# Patient Record
Sex: Male | Born: 1965 | Race: White | Hispanic: No | Marital: Married | Smoking: Current every day smoker
Health system: Southern US, Community
[De-identification: ages and names within clinical notes are randomized; demographics above are authoritative.]

## PROBLEM LIST (undated history)

## (undated) DIAGNOSIS — R51 Headache: Secondary | ICD-10-CM

## (undated) DIAGNOSIS — J189 Pneumonia, unspecified organism: Secondary | ICD-10-CM

## (undated) DIAGNOSIS — E119 Type 2 diabetes mellitus without complications: Secondary | ICD-10-CM

## (undated) DIAGNOSIS — J40 Bronchitis, not specified as acute or chronic: Secondary | ICD-10-CM

## (undated) DIAGNOSIS — I2692 Saddle embolus of pulmonary artery without acute cor pulmonale: Secondary | ICD-10-CM

## (undated) DIAGNOSIS — I82409 Acute embolism and thrombosis of unspecified deep veins of unspecified lower extremity: Secondary | ICD-10-CM

## (undated) DIAGNOSIS — R519 Headache, unspecified: Secondary | ICD-10-CM

## (undated) HISTORY — DX: Saddle embolus of pulmonary artery without acute cor pulmonale: I26.92

## (undated) HISTORY — PX: OTHER SURGICAL HISTORY: SHX169

## (undated) HISTORY — DX: Acute embolism and thrombosis of unspecified deep veins of unspecified lower extremity: I82.409

## (undated) HISTORY — DX: Type 2 diabetes mellitus without complications: E11.9

---

## 2006-02-19 DIAGNOSIS — J189 Pneumonia, unspecified organism: Secondary | ICD-10-CM

## 2006-02-19 HISTORY — DX: Pneumonia, unspecified organism: J18.9

## 2011-04-24 ENCOUNTER — Other Ambulatory Visit: Payer: Self-pay | Admitting: Occupational Medicine

## 2011-04-24 ENCOUNTER — Ambulatory Visit: Payer: Self-pay

## 2011-04-24 DIAGNOSIS — M79643 Pain in unspecified hand: Secondary | ICD-10-CM

## 2011-07-03 ENCOUNTER — Institutional Professional Consult (permissible substitution): Payer: Self-pay | Admitting: Internal Medicine

## 2012-05-20 ENCOUNTER — Telehealth: Payer: Self-pay | Admitting: Physician Assistant

## 2012-05-20 NOTE — Telephone Encounter (Signed)
APPT MADE

## 2012-05-21 ENCOUNTER — Ambulatory Visit: Payer: Self-pay | Admitting: Physician Assistant

## 2012-06-03 ENCOUNTER — Telehealth: Payer: Self-pay | Admitting: Family Medicine

## 2012-06-03 NOTE — Telephone Encounter (Signed)
Appt made for 06-04-12 

## 2012-06-04 ENCOUNTER — Ambulatory Visit: Payer: Self-pay | Admitting: General Practice

## 2012-09-12 ENCOUNTER — Ambulatory Visit (INDEPENDENT_AMBULATORY_CARE_PROVIDER_SITE_OTHER): Payer: 59

## 2012-09-12 ENCOUNTER — Encounter: Payer: Self-pay | Admitting: Family Medicine

## 2012-09-12 ENCOUNTER — Ambulatory Visit (INDEPENDENT_AMBULATORY_CARE_PROVIDER_SITE_OTHER): Payer: 59 | Admitting: Family Medicine

## 2012-09-12 VITALS — BP 114/70 | HR 77 | Temp 97.0°F | Ht 78.0 in | Wt 289.0 lb

## 2012-09-12 DIAGNOSIS — R0989 Other specified symptoms and signs involving the circulatory and respiratory systems: Secondary | ICD-10-CM

## 2012-09-12 DIAGNOSIS — J069 Acute upper respiratory infection, unspecified: Secondary | ICD-10-CM

## 2012-09-12 MED ORDER — AZITHROMYCIN 250 MG PO TABS: ORAL_TABLET | ORAL | Status: DC

## 2012-09-12 MED ORDER — PREDNISONE 50 MG PO TABS: ORAL_TABLET | ORAL | Status: DC

## 2012-09-12 NOTE — Progress Notes (Signed)
  Subjective:    Patient ID: Kevin Beck, male    DOB: [birth year], 46 y.o.   MRN: [id number]  HPI URI Symptoms Onset: 3-4 days  Description: rhinorrhea, nasal congestion, cough, mild SOB  Modifying factors:  1/2-1 PPD smoker; ? Hx/o COPD   Symptoms Nasal discharge: yes Fever: no Sore throat: no Cough: yes Wheezing: minimal  Ear pain: no GI symptoms: no Sick contacts: no  Red Flags  Stiff neck: no Dyspnea: yes Rash: no Swallowing difficulty: no  Sinusitis Risk Factors Headache/face pain: no Double sickening: no tooth pain: no  Allergy Risk Factors Sneezing: no Itchy scratchy throat: no Seasonal symptoms: no  Flu Risk Factors Headache: no muscle aches:no severe fatigue: no     Review of Systems  All other systems reviewed and are negative.       Objective:   Physical Exam  Constitutional: He appears well-developed and well-nourished.  HENT:  Head: Normocephalic and atraumatic.  Right Ear: External ear normal.  Left Ear: External ear normal.  +nasal erythema, rhinorrhea bilaterally, + post oropharyngeal erythema    Eyes: Conjunctivae are normal. Pupils are equal, round, and reactive to light.  Neck: Normal range of motion. Neck supple.  Cardiovascular: Normal rate, regular rhythm and normal heart sounds.   Pulmonary/Chest: Effort normal.  Faint wheezes and rales in upper lobes    Abdominal: Soft.  Musculoskeletal: Normal range of motion.  Lymphadenopathy:    He has no cervical adenopathy.  Neurological: He is alert.  Skin: Skin is warm.     [facility] reading (PRIMARY) by  Dr. Alvester Morin                                  Preliminary read negative for any focal PNA, though with some mild perihilar markings consistent with ? Bronchitis/URI       Assessment & Plan:  Chest congestion - Plan: DG Chest 2 View  URI (upper respiratory infection) - Plan: predniSONE (DELTASONE) 50 MG tablet, azithromycin (ZITHROMAX) 250 MG tablet  Will place on course of  prednisone and zpak for treatment.  CXR negative for any focal infiltrate Discussed smoking cessation at length.  Discussed infectious and resp red flags at length.  Follow up as needed.     The patient and/or caregiver has been counseled thoroughly with regard to treatment plan and/or medications prescribed including dosage, schedule, interactions, rationale for use, and possible side effects and they verbalize understanding. Diagnoses and expected course of recovery discussed and will return if not improved as expected or if the condition worsens. Patient and/or caregiver verbalized understanding.

## 2012-12-08 ENCOUNTER — Encounter (INDEPENDENT_AMBULATORY_CARE_PROVIDER_SITE_OTHER): Payer: Self-pay | Admitting: Surgery

## 2012-12-08 ENCOUNTER — Ambulatory Visit (INDEPENDENT_AMBULATORY_CARE_PROVIDER_SITE_OTHER): Payer: 59 | Admitting: Family Medicine

## 2012-12-08 ENCOUNTER — Telehealth: Payer: Self-pay | Admitting: Nurse Practitioner

## 2012-12-08 ENCOUNTER — Encounter (INDEPENDENT_AMBULATORY_CARE_PROVIDER_SITE_OTHER): Payer: Self-pay

## 2012-12-08 ENCOUNTER — Encounter: Payer: Self-pay | Admitting: Family Medicine

## 2012-12-08 ENCOUNTER — Ambulatory Visit (INDEPENDENT_AMBULATORY_CARE_PROVIDER_SITE_OTHER): Payer: 59 | Admitting: Surgery

## 2012-12-08 VITALS — BP 118/76 | HR 76 | Temp 98.6°F | Ht 78.0 in | Wt 290.4 lb

## 2012-12-08 VITALS — BP 122/80 | HR 78 | Temp 98.1°F | Resp 18 | Ht 78.0 in | Wt 289.4 lb

## 2012-12-08 DIAGNOSIS — K612 Anorectal abscess: Secondary | ICD-10-CM

## 2012-12-08 DIAGNOSIS — L0231 Cutaneous abscess of buttock: Secondary | ICD-10-CM

## 2012-12-08 DIAGNOSIS — K611 Rectal abscess: Secondary | ICD-10-CM

## 2012-12-08 MED ORDER — METRONIDAZOLE 500 MG PO TABS: 500.0000 mg | ORAL_TABLET | Freq: Three times a day (TID) | ORAL | Status: DC

## 2012-12-08 MED ORDER — SULFAMETHOXAZOLE-TMP DS 800-160 MG PO TABS: 1.0000 | ORAL_TABLET | Freq: Two times a day (BID) | ORAL | Status: DC

## 2012-12-08 MED ORDER — HYDROCODONE-ACETAMINOPHEN 5-325 MG PO TABS: 1.0000 | ORAL_TABLET | Freq: Four times a day (QID) | ORAL | Status: DC | PRN
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
## 2012-12-08 NOTE — Progress Notes (Signed)
  Subjective:    Patient ID: Kevin Beck, male    DOB: [birth year], 47 y.o.   MRN: [id number]  HPI This 47 y.o. male presents for evaluation of fever, malaise, boil on right buttocks, and URI sx's.   Review of Systems C/o abscess, fever, malaise, and uri sx's. No chest pain, SOB, HA, dizziness, vision change, N/V, diarrhea, constipation, dysuria, urinary urgency or frequency, myalgias, arthralgias or rash.     Objective:   Physical Exam Vital signs noted  Well developed well nourished male.  HEENT - Head atraumatic Normocephalic                Eyes - PERRLA, Conjuctiva - clear Sclera- Clear EOMI                Ears - EAC's Wnl TM's Wnl Gross Hearing WNL                Nose - Nares patent                 Throat - oropharanx wnl Respiratory - Lungs CTA bilateral Cardiac - RRR S1 and S2 without murmur Rectal - Right buttock with perirectal abscess that is draining.       Assessment & Plan:  Perirectal abscess - Plan: sulfamethoxazole-trimethoprim (BACTRIM DS) 800-160 MG per tablet, metroNIDAZOLE (FLAGYL) 500 MG tablet, HYDROcodone-acetaminophen (NORCO) 5-325 MG per tablet, Ambulatory referral to General Surgery.  General Surgery Consult ASAP.  Deatra Canter FNP

## 2012-12-08 NOTE — Telephone Encounter (Signed)
Appt given for today 

## 2012-12-08 NOTE — Patient Instructions (Signed)
Abscess An abscess is an infected area that contains a collection of pus and debris.It can occur in almost any part of the body. An abscess is also known as a furuncle or boil. CAUSES  An abscess occurs when tissue gets infected. This can occur from blockage of oil or sweat glands, infection of hair follicles, or a minor injury to the skin. As the body tries to fight the infection, pus collects in the area and creates pressure under the skin. This pressure causes pain. People with weakened immune systems have difficulty fighting infections and get certain abscesses more often.  SYMPTOMS Usually an abscess develops on the skin and becomes a painful mass that is red, warm, and tender. If the abscess forms under the skin, you may feel a moveable soft area under the skin. Some abscesses break open (rupture) on their own, but most will continue to get worse without care. The infection can spread deeper into the body and eventually into the bloodstream, causing you to feel ill.  DIAGNOSIS  Your caregiver will take your medical history and perform a physical exam. A sample of fluid may also be taken from the abscess to determine what is causing your infection. TREATMENT  Your caregiver may prescribe antibiotic medicines to fight the infection. However, taking antibiotics alone usually does not cure an abscess. Your caregiver may need to make a small cut (incision) in the abscess to drain the pus. In some cases, gauze is packed into the abscess to reduce pain and to continue draining the area. HOME CARE INSTRUCTIONS   Only take over-the-counter or prescription medicines for pain, discomfort, or fever as directed by your caregiver.  If you were prescribed antibiotics, take them as directed. Finish them even if you start to feel better.  If gauze is used, follow your caregiver's directions for changing the gauze.  To avoid spreading the infection:  Keep your draining abscess covered with a  bandage.  Wash your hands well.  Do not share personal care items, towels, or whirlpools with others.  Avoid skin contact with others.  Keep your skin and clothes clean around the abscess.  Keep all follow-up appointments as directed by your caregiver. SEEK MEDICAL CARE IF:   You have increased pain, swelling, redness, fluid drainage, or bleeding.  You have muscle aches, chills, or a general ill feeling.  You have a fever. MAKE SURE YOU:   Understand these instructions.  Will watch your condition.  Will get help right away if you are not doing well or get worse. Document Released: 11/15/2004 Document Revised: 08/07/2011 Document Reviewed: 04/20/2011 ExitCare Patient Information 2014 [employer].  

## 2012-12-09 ENCOUNTER — Telehealth: Payer: Self-pay | Admitting: Family Medicine

## 2013-01-05 ENCOUNTER — Other Ambulatory Visit (INDEPENDENT_AMBULATORY_CARE_PROVIDER_SITE_OTHER): Payer: 59

## 2013-01-05 DIAGNOSIS — M7989 Other specified soft tissue disorders: Secondary | ICD-10-CM

## 2013-01-05 NOTE — Progress Notes (Signed)
Pt came in for labs from dr. Andres Labrum

## 2013-01-06 LAB — CBC WITH DIFFERENTIAL
Basophils Absolute: 0.1 10*3/uL (ref 0.0–0.2)
Basos: 1 %
Eos: 4 %
Eosinophils Absolute: 0.2 10*3/uL (ref 0.0–0.4)
HCT: 44.3 % (ref 37.5–51.0)
Hemoglobin: 16.1 g/dL (ref 12.6–17.7)
Immature Grans (Abs): 0 10*3/uL (ref 0.0–0.1)
Immature Granulocytes: 0 %
Lymphocytes Absolute: 1.9 10*3/uL (ref 0.7–3.1)
Lymphs: 29 %
MCH: 31 pg (ref 26.6–33.0)
MCHC: 36.3 g/dL — ABNORMAL HIGH (ref 31.5–35.7)
MCV: 85 fL (ref 79–97)
Monocytes Absolute: 0.6 10*3/uL (ref 0.1–0.9)
Monocytes: 9 %
Neutrophils Absolute: 3.9 10*3/uL (ref 1.4–7.0)
Neutrophils Relative %: 57 %
Platelets: 161 10*3/uL (ref 150–379)
RBC: 5.2 x10E6/uL (ref 4.14–5.80)
RDW: 13.4 % (ref 12.3–15.4)
WBC: 6.7 10*3/uL (ref 3.4–10.8)

## 2013-01-06 LAB — SEDIMENTATION RATE: Sed Rate: 5 mm/hr (ref 0–15)

## 2013-01-06 LAB — URIC ACID: Uric Acid: 5.1 mg/dL (ref 3.7–8.6)

## 2013-01-06 LAB — RHEUMATOID FACTOR: Rhuematoid fact SerPl-aCnc: 10.2 IU/mL (ref 0.0–13.9)

## 2013-01-06 LAB — C-REACTIVE PROTEIN: CRP: 9.9 mg/L — ABNORMAL HIGH (ref 0.0–4.9)

## 2013-01-21 ENCOUNTER — Telehealth: Payer: Self-pay | Admitting: *Deleted

## 2013-01-21 NOTE — Telephone Encounter (Signed)
Message copied by Baltazar Apo on Wed Jan 21, 2013  8:56 AM ------      Message from: Deatra Canter      Created: Wed Jan 07, 2013  9:39 AM       Arthritis lab shows mildly elevated CRP.  Can refer to RA if arthritis pain is not improving. ------

## 2013-02-10 NOTE — Telephone Encounter (Signed)
He wants lab results sent to Dr. Lorie Phenix - a bone  / joint specialist in [facility].

## 2013-02-16 NOTE — Telephone Encounter (Signed)
Please send labs

## 2013-11-05 ENCOUNTER — Encounter: Payer: Self-pay | Admitting: *Deleted

## 2013-11-05 ENCOUNTER — Ambulatory Visit (INDEPENDENT_AMBULATORY_CARE_PROVIDER_SITE_OTHER): Payer: PRIVATE HEALTH INSURANCE | Admitting: Family Medicine

## 2013-11-05 ENCOUNTER — Encounter: Payer: Self-pay | Admitting: Family Medicine

## 2013-11-05 VITALS — BP 126/75 | HR 72 | Temp 98.1°F | Ht 78.0 in | Wt 286.6 lb

## 2013-11-05 DIAGNOSIS — J208 Acute bronchitis due to other specified organisms: Secondary | ICD-10-CM

## 2013-11-05 DIAGNOSIS — J209 Acute bronchitis, unspecified: Secondary | ICD-10-CM

## 2013-11-05 MED ORDER — LEVOFLOXACIN 500 MG PO TABS: 500.0000 mg | ORAL_TABLET | Freq: Every day | ORAL | Status: DC

## 2013-11-05 MED ORDER — METHYLPREDNISOLONE (PAK) 4 MG PO TABS: ORAL_TABLET | ORAL | Status: DC

## 2013-11-05 MED ORDER — HYDROCODONE-HOMATROPINE 5-1.5 MG/5ML PO SYRP: 5.0000 mL | ORAL_SOLUTION | Freq: Three times a day (TID) | ORAL | Status: DC | PRN

## 2013-11-05 NOTE — Progress Notes (Signed)
   Subjective:    Patient ID: Kevin Beck, male    DOB: 01-30-1966, 48 y.o.   MRN: [id number]  HPI This 48 y.o. male presents for evaluation of uri sx's and cough.   Review of Systems No chest pain, SOB, HA, dizziness, vision change, N/V, diarrhea, constipation, dysuria, urinary urgency or frequency, myalgias, arthralgias or rash.     Objective:   Physical Exam Vital signs noted  Well developed well nourished male.  HEENT - Head atraumatic Normocephalic                Eyes - PERRLA, Conjuctiva - clear Sclera- Clear EOMI                Ears - EAC's Wnl TM's Wnl Gross Hearing WNL                Nose - Nares patent                 Throat - oropharanx wnl Respiratory - Lungs CTA bilateral Cardiac - RRR S1 and S2 without murmur GI - Abdomen soft Nontender and bowel sounds active x 4 Extremities - No edema. Neuro - Grossly intact.       Assessment & Plan:  Acute bronchitis due to other specified organisms - Plan: levofloxacin (LEVAQUIN) 500 MG tablet, methylPREDNIsolone (MEDROL DOSPACK) 4 MG tablet, HYDROcodone-homatropine (HYCODAN) 5-1.5 MG/5ML syrup  Lysbeth Penner FNP

## 2014-03-15 ENCOUNTER — Other Ambulatory Visit (HOSPITAL_COMMUNITY): Payer: Self-pay | Admitting: *Deleted

## 2014-03-15 ENCOUNTER — Other Ambulatory Visit: Payer: Self-pay | Admitting: Orthopedic Surgery

## 2014-03-15 NOTE — Pre-Procedure Instructions (Signed)
Kevin Beck  03/15/2014   Your procedure is scheduled on:  Thursday, March 18, 2014 at 7:30 AM.   Report to [facility] Entrance "A" Admitting Office at 5:30 AM.   Call this number if you have problems the morning of surgery: [phone]    Remember:   Do not eat food or drink liquids after midnight Wednesday, 03/17/14.   Take these medicines the morning of surgery with A SIP OF WATER: Hydrocodone - if needed.   Do not wear jewelry.  Do not wear lotions, powders, or cologne. You may wear deodorant.  Men may shave face and neck.  Do not bring valuables to the hospital.  [facility] is not responsible                  for any belongings or valuables.               Contacts, dentures or bridgework may not be worn into surgery.  Leave suitcase in the car. After surgery it may be brought to your room.  For patients admitted to the hospital, discharge time is determined by your                treatment team.              Special Instructions: [facility] - Preparing for Surgery  Before surgery, you can play an important role.  Because skin is not sterile, your skin needs to be as free of germs as possible.  You can reduce the number of germs on you skin by washing with CHG (chlorahexidine gluconate) soap before surgery.  CHG is an antiseptic cleaner which kills germs and bonds with the skin to continue killing germs even after washing.  Please DO NOT use if you have an allergy to CHG or antibacterial soaps.  If your skin becomes reddened/irritated stop using the CHG and inform your nurse when you arrive at Short Stay.  Do not shave (including legs and underarms) for at least 48 hours prior to the first CHG shower.  You may shave your face.  Please follow these instructions carefully:   1.  Shower with CHG Soap the night before surgery and the                                morning of Surgery.  2.  If you choose to wash your hair, wash your hair first as usual with your        normal shampoo.  3.  After you shampoo, rinse your hair and body thoroughly to remove the                      Shampoo.  4.  Use CHG as you would any other liquid soap.  You can apply chg directly       to the skin and wash gently with scrungie or a clean washcloth.  5.  Apply the CHG Soap to your body ONLY FROM THE NECK DOWN.        Do not use on open wounds or open sores.  Avoid contact with your eyes, ears, mouth and genitals (private parts).  Wash genitals (private parts) with your normal soap.  6.  Wash thoroughly, paying special attention to the area where your surgery        will be performed.  7.  Thoroughly rinse your body with warm water  from the neck down.  8.  DO NOT shower/wash with your normal soap after using and rinsing off       the CHG Soap.  9.  Pat yourself dry with a clean towel.            10.  Wear clean pajamas.            11.  Place clean sheets on your bed the night of your first shower and do not        sleep with pets.  Day of Surgery  Do not apply any lotions the morning of surgery.  Please wear clean clothes to the hospital.     Please read over the following fact sheets that you were given: Pain Booklet, Coughing and Deep Breathing, Blood Transfusion Information, MRSA Information and Surgical Site Infection Prevention

## 2014-03-15 NOTE — Progress Notes (Signed)
Pre-op orders not signed in EPIC. Called Dr. Laurena Bering office and spoke with Angela Nevin requesting orders be signed.

## 2014-03-16 ENCOUNTER — Encounter (HOSPITAL_COMMUNITY)
Admission: RE | Admit: 2014-03-16 | Discharge: 2014-03-16 | Disposition: A | Discharge status: Home / Self Care | Payer: Worker's Compensation | Source: Ambulatory Visit | Attending: Orthopedic Surgery | Admitting: Orthopedic Surgery

## 2014-03-16 ENCOUNTER — Encounter (HOSPITAL_COMMUNITY): Payer: Self-pay

## 2014-03-16 DIAGNOSIS — J42 Unspecified chronic bronchitis: Secondary | ICD-10-CM | POA: Diagnosis not present

## 2014-03-16 DIAGNOSIS — M5412 Radiculopathy, cervical region: Secondary | ICD-10-CM | POA: Diagnosis not present

## 2014-03-16 DIAGNOSIS — Z01818 Encounter for other preprocedural examination: Secondary | ICD-10-CM | POA: Diagnosis not present

## 2014-03-16 DIAGNOSIS — F17210 Nicotine dependence, cigarettes, uncomplicated: Secondary | ICD-10-CM | POA: Diagnosis not present

## 2014-03-16 DIAGNOSIS — G9529 Other cord compression: Secondary | ICD-10-CM | POA: Diagnosis present

## 2014-03-16 DIAGNOSIS — Z880 Allergy status to penicillin: Secondary | ICD-10-CM | POA: Diagnosis not present

## 2014-03-16 DIAGNOSIS — M4802 Spinal stenosis, cervical region: Secondary | ICD-10-CM | POA: Diagnosis not present

## 2014-03-16 DIAGNOSIS — Z87891 Personal history of nicotine dependence: Secondary | ICD-10-CM | POA: Insufficient documentation

## 2014-03-16 HISTORY — DX: Headache, unspecified: R51.9

## 2014-03-16 HISTORY — DX: Bronchitis, not specified as acute or chronic: J40

## 2014-03-16 HISTORY — DX: Headache: R51

## 2014-03-16 HISTORY — DX: Pneumonia, unspecified organism: J18.9

## 2014-03-16 LAB — COMPREHENSIVE METABOLIC PANEL
ALT: 53 U/L (ref 0–53)
AST: 35 U/L (ref 0–37)
Albumin: 3.9 g/dL (ref 3.5–5.2)
Alkaline Phosphatase: 100 U/L (ref 39–117)
Anion gap: 10 (ref 5–15)
BUN: 10 mg/dL (ref 6–23)
CALCIUM: 9.3 mg/dL (ref 8.4–10.5)
CHLORIDE: 107 mmol/L (ref 96–112)
CO2: 24 mmol/L (ref 19–32)
CREATININE: 0.99 mg/dL (ref 0.50–1.35)
GFR calc Af Amer: >90 mL/min (ref >90)
GFR calc non Af Amer: >90 mL/min (ref >90)
Glucose, Bld: 116 mg/dL — ABNORMAL HIGH (ref 70–99)
Potassium: 4.2 mmol/L (ref 3.5–5.1)
Sodium: 141 mmol/L (ref 135–145)
TOTAL PROTEIN: 7 g/dL (ref 6.0–8.3)
Total Bilirubin: 0.6 mg/dL (ref 0.3–1.2)

## 2014-03-16 LAB — CBC WITH DIFFERENTIAL/PLATELET
BASOS ABS: 0.1 10*3/uL (ref 0.0–0.1)
Basophils Relative: 1 % (ref 0–1)
EOS ABS: 0.2 10*3/uL (ref 0.0–0.7)
EOS PCT: 3 % (ref 0–5)
HCT: 48.4 % (ref 39.0–52.0)
Hemoglobin: 17 g/dL (ref 13.0–17.0)
Lymphocytes Relative: 25 % (ref 12–46)
Lymphs Abs: 1.6 10*3/uL (ref 0.7–4.0)
MCH: 30.6 pg (ref 26.0–34.0)
MCHC: 35.1 g/dL (ref 30.0–36.0)
MCV: 87.1 fL (ref 78.0–100.0)
MONO ABS: 0.5 10*3/uL (ref 0.1–1.0)
MONOS PCT: 7 % (ref 3–12)
Neutro Abs: 4.2 10*3/uL (ref 1.7–7.7)
Neutrophils Relative %: 64 % (ref 43–77)
Platelets: 169 10*3/uL (ref 150–400)
RBC: 5.56 MIL/uL (ref 4.22–5.81)
RDW: 12.9 % (ref 11.5–15.5)
WBC: 6.6 10*3/uL (ref 4.0–10.5)

## 2014-03-16 LAB — PROTIME-INR
INR: 0.93 (ref 0.00–1.49)
Prothrombin Time: 12.6 seconds (ref 11.6–15.2)

## 2014-03-16 LAB — SURGICAL PCR SCREEN
MRSA, PCR: NEGATIVE
STAPHYLOCOCCUS AUREUS: NEGATIVE

## 2014-03-16 LAB — URINALYSIS, ROUTINE W REFLEX MICROSCOPIC
Bilirubin Urine: NEGATIVE
GLUCOSE, UA: NEGATIVE mg/dL
HGB URINE DIPSTICK: NEGATIVE
Ketones, ur: NEGATIVE mg/dL
Leukocytes, UA: NEGATIVE
NITRITE: NEGATIVE
PROTEIN: NEGATIVE mg/dL
SPECIFIC GRAVITY, URINE: 1.023 (ref 1.005–1.030)
UROBILINOGEN UA: 0.2 mg/dL (ref 0.0–1.0)
pH: 5 (ref 5.0–8.0)

## 2014-03-16 LAB — TYPE AND SCREEN
ABO/RH(D): O POS
ANTIBODY SCREEN: NEGATIVE

## 2014-03-16 LAB — APTT: aPTT: 31 seconds (ref 24–37)

## 2014-03-16 LAB — ABO/RH: ABO/RH(D): O POS

## 2014-03-17 MED ORDER — VANCOMYCIN HCL 10 G IV SOLR
1500.0000 mg | INTRAVENOUS | Status: AC
  Administered 2014-03-18: 1500 mg via INTRAVENOUS
  Filled 2014-03-17: qty 1500
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
## 2014-03-18 ENCOUNTER — Ambulatory Visit (HOSPITAL_COMMUNITY): Payer: Worker's Compensation

## 2014-03-18 ENCOUNTER — Ambulatory Visit (HOSPITAL_COMMUNITY): Payer: Worker's Compensation | Admitting: Anesthesiology

## 2014-03-18 ENCOUNTER — Encounter (HOSPITAL_COMMUNITY)
Admission: RE | Disposition: A | Discharge status: Home / Self Care | Payer: Self-pay | Source: Ambulatory Visit | Attending: Orthopedic Surgery

## 2014-03-18 ENCOUNTER — Ambulatory Visit (HOSPITAL_COMMUNITY)
Admission: RE | Admit: 2014-03-18 | Discharge: 2014-03-19 | Disposition: A | Discharge status: Home / Self Care | Payer: Worker's Compensation | Source: Ambulatory Visit | Attending: Orthopedic Surgery | Admitting: Orthopedic Surgery

## 2014-03-18 ENCOUNTER — Encounter (HOSPITAL_COMMUNITY): Payer: Self-pay | Admitting: *Deleted

## 2014-03-18 DIAGNOSIS — M5412 Radiculopathy, cervical region: Secondary | ICD-10-CM | POA: Insufficient documentation

## 2014-03-18 DIAGNOSIS — M4802 Spinal stenosis, cervical region: Secondary | ICD-10-CM | POA: Insufficient documentation

## 2014-03-18 DIAGNOSIS — G549 Nerve root and plexus disorder, unspecified: Secondary | ICD-10-CM | POA: Diagnosis present

## 2014-03-18 DIAGNOSIS — Z880 Allergy status to penicillin: Secondary | ICD-10-CM | POA: Insufficient documentation

## 2014-03-18 DIAGNOSIS — F17210 Nicotine dependence, cigarettes, uncomplicated: Secondary | ICD-10-CM | POA: Insufficient documentation

## 2014-03-18 DIAGNOSIS — Z419 Encounter for procedure for purposes other than remedying health state, unspecified: Secondary | ICD-10-CM

## 2014-03-18 DIAGNOSIS — G9529 Other cord compression: Secondary | ICD-10-CM | POA: Insufficient documentation

## 2014-03-18 HISTORY — PX: ANTERIOR CERVICAL DECOMP/DISCECTOMY FUSION: SHX1161

## 2014-03-18 SURGERY — ANTERIOR CERVICAL DECOMPRESSION/DISCECTOMY FUSION 2 LEVELS
Anesthesia: General | Site: Neck

## 2014-03-18 MED ORDER — HYDROMORPHONE HCL 1 MG/ML IJ SOLN
0.2500 mg | INTRAMUSCULAR | Status: DC | PRN
  Administered 2014-03-18 (×2): 0.5 mg via INTRAVENOUS

## 2014-03-18 MED ORDER — LIDOCAINE HCL (CARDIAC) 20 MG/ML IV SOLN
INTRAVENOUS | Status: AC
  Filled 2014-03-18: qty 5

## 2014-03-18 MED ORDER — HYDROCODONE-HOMATROPINE 5-1.5 MG/5ML PO SYRP: 5.0000 mL | ORAL_SOLUTION | Freq: Three times a day (TID) | ORAL | Status: DC | PRN

## 2014-03-18 MED ORDER — OXYCODONE-ACETAMINOPHEN 5-325 MG PO TABS
1.0000 | ORAL_TABLET | ORAL | Status: DC | PRN
  Administered 2014-03-18 – 2014-03-19 (×3): 2 via ORAL
  Filled 2014-03-18 (×3): qty 2

## 2014-03-18 MED ORDER — LIDOCAINE HCL (CARDIAC) 20 MG/ML IV SOLN
INTRAVENOUS | Status: AC
  Filled 2014-03-18: qty 10

## 2014-03-18 MED ORDER — FENTANYL CITRATE 0.05 MG/ML IJ SOLN
INTRAMUSCULAR | Status: DC | PRN
  Administered 2014-03-18 (×4): 50 ug via INTRAVENOUS
  Administered 2014-03-18 (×2): 100 ug via INTRAVENOUS
  Administered 2014-03-18: 50 ug via INTRAVENOUS
  Administered 2014-03-18: 100 ug via INTRAVENOUS
  Administered 2014-03-18: 150 ug via INTRAVENOUS
  Administered 2014-03-18: 50 ug via INTRAVENOUS

## 2014-03-18 MED ORDER — SENNOSIDES-DOCUSATE SODIUM 8.6-50 MG PO TABS
1.0000 | ORAL_TABLET | Freq: Every evening | ORAL | Status: DC | PRN
  Filled 2014-03-18: qty 1

## 2014-03-18 MED ORDER — BUPIVACAINE-EPINEPHRINE (PF) 0.25% -1:200000 IJ SOLN
INTRAMUSCULAR | Status: AC
  Filled 2014-03-18: qty 30

## 2014-03-18 MED ORDER — GLYCOPYRROLATE 0.2 MG/ML IJ SOLN
INTRAMUSCULAR | Status: AC
  Filled 2014-03-18: qty 3

## 2014-03-18 MED ORDER — SODIUM CHLORIDE 0.9 % IJ SOLN
3.0000 mL | Freq: Two times a day (BID) | INTRAMUSCULAR | Status: DC
  Administered 2014-03-18 (×2): 3 mL via INTRAVENOUS

## 2014-03-18 MED ORDER — ARTIFICIAL TEARS OP OINT
TOPICAL_OINTMENT | OPHTHALMIC | Status: AC
  Filled 2014-03-18: qty 3.5

## 2014-03-18 MED ORDER — THROMBIN 20000 UNITS EX SOLR
CUTANEOUS | Status: AC
  Filled 2014-03-18: qty 40000

## 2014-03-18 MED ORDER — SODIUM CHLORIDE 0.9 % IV SOLN
INTRAVENOUS | Status: DC | PRN
  Administered 2014-03-18: 07:00:00 via INTRAVENOUS

## 2014-03-18 MED ORDER — FENTANYL CITRATE 0.05 MG/ML IJ SOLN
INTRAMUSCULAR | Status: AC
  Filled 2014-03-18: qty 5

## 2014-03-18 MED ORDER — MORPHINE SULFATE 2 MG/ML IJ SOLN: 1.0000 mg | INTRAMUSCULAR | Status: DC | PRN

## 2014-03-18 MED ORDER — ONDANSETRON HCL 4 MG/2ML IJ SOLN
INTRAMUSCULAR | Status: AC
  Filled 2014-03-18: qty 2

## 2014-03-18 MED ORDER — ONDANSETRON HCL 4 MG/2ML IJ SOLN
INTRAMUSCULAR | Status: DC | PRN
  Administered 2014-03-18 (×2): 4 mg via INTRAVENOUS

## 2014-03-18 MED ORDER — PHENYLEPHRINE 40 MCG/ML (10ML) SYRINGE FOR IV PUSH (FOR BLOOD PRESSURE SUPPORT)
PREFILLED_SYRINGE | INTRAVENOUS | Status: AC
  Filled 2014-03-18: qty 20

## 2014-03-18 MED ORDER — VANCOMYCIN HCL 10 G IV SOLR
1250.0000 mg | Freq: Once | INTRAVENOUS | Status: AC
  Administered 2014-03-18: 1250 mg via INTRAVENOUS
  Filled 2014-03-18: qty 1250

## 2014-03-18 MED ORDER — LACTATED RINGERS IV SOLN
INTRAVENOUS | Status: DC | PRN
  Administered 2014-03-18 (×2): via INTRAVENOUS

## 2014-03-18 MED ORDER — HYDROMORPHONE HCL 1 MG/ML IJ SOLN
INTRAMUSCULAR | Status: AC
  Administered 2014-03-18: 0.5 mg via INTRAVENOUS
  Filled 2014-03-18: qty 1

## 2014-03-18 MED ORDER — PHENYLEPHRINE HCL 10 MG/ML IJ SOLN
10.0000 mg | INTRAMUSCULAR | Status: DC | PRN
  Administered 2014-03-18 (×2): 15 ug/min via INTRAVENOUS

## 2014-03-18 MED ORDER — LIDOCAINE HCL (CARDIAC) 20 MG/ML IV SOLN
INTRAVENOUS | Status: DC | PRN
  Administered 2014-03-18: 100 mg via INTRAVENOUS

## 2014-03-18 MED ORDER — ACETAMINOPHEN 325 MG PO TABS: 650.0000 mg | ORAL_TABLET | ORAL | Status: DC | PRN

## 2014-03-18 MED ORDER — MENTHOL 3 MG MT LOZG
1.0000 | LOZENGE | OROMUCOSAL | Status: DC | PRN
  Filled 2014-03-18: qty 9

## 2014-03-18 MED ORDER — PROMETHAZINE HCL 25 MG/ML IJ SOLN: 6.2500 mg | INTRAMUSCULAR | Status: DC | PRN

## 2014-03-18 MED ORDER — LIDOCAINE HCL 4 % MT SOLN
OROMUCOSAL | Status: DC | PRN
  Administered 2014-03-18: 4 mL via TOPICAL

## 2014-03-18 MED ORDER — PHENOL 1.4 % MT LIQD: 1.0000 | OROMUCOSAL | Status: DC | PRN

## 2014-03-18 MED ORDER — 0.9 % SODIUM CHLORIDE (POUR BTL) OPTIME
TOPICAL | Status: DC | PRN
  Administered 2014-03-18: 1000 mL

## 2014-03-18 MED ORDER — ACETAMINOPHEN 650 MG RE SUPP: 650.0000 mg | RECTAL | Status: DC | PRN

## 2014-03-18 MED ORDER — THROMBIN 20000 UNITS EX SOLR
CUTANEOUS | Status: DC | PRN
  Administered 2014-03-18: 20 mL via TOPICAL

## 2014-03-18 MED ORDER — ALUM & MAG HYDROXIDE-SIMETH 200-200-20 MG/5ML PO SUSP
30.0000 mL | Freq: Four times a day (QID) | ORAL | Status: DC | PRN
Start: 2014-03-18 — End: 2014-03-19

## 2014-03-18 MED ORDER — SODIUM CHLORIDE 0.9 % IJ SOLN: 3.0000 mL | INTRAMUSCULAR | Status: DC | PRN

## 2014-03-18 MED ORDER — MIDAZOLAM HCL 5 MG/5ML IJ SOLN
INTRAMUSCULAR | Status: DC | PRN
Start: 2014-03-18 — End: 2014-03-18
  Administered 2014-03-18: 2 mg via INTRAVENOUS

## 2014-03-18 MED ORDER — MIDAZOLAM HCL 2 MG/2ML IJ SOLN
INTRAMUSCULAR | Status: AC
  Filled 2014-03-18: qty 2

## 2014-03-18 MED ORDER — ARTIFICIAL TEARS OP OINT
TOPICAL_OINTMENT | OPHTHALMIC | Status: DC | PRN
  Administered 2014-03-18: 1 via OPHTHALMIC

## 2014-03-18 MED ORDER — STERILE WATER FOR INJECTION IJ SOLN
INTRAMUSCULAR | Status: AC
  Filled 2014-03-18: qty 20

## 2014-03-18 MED ORDER — ONDANSETRON HCL 4 MG/2ML IJ SOLN: 4.0000 mg | INTRAMUSCULAR | Status: DC | PRN

## 2014-03-18 MED ORDER — DOCUSATE SODIUM 100 MG PO CAPS
100.0000 mg | ORAL_CAPSULE | Freq: Two times a day (BID) | ORAL | Status: DC
Start: 2014-03-18 — End: 2014-03-19
  Administered 2014-03-18: 100 mg via ORAL
  Filled 2014-03-18: qty 1

## 2014-03-18 MED ORDER — PROPOFOL 10 MG/ML IV BOLUS
INTRAVENOUS | Status: AC
  Filled 2014-03-18: qty 20

## 2014-03-18 MED ORDER — BISACODYL 5 MG PO TBEC
5.0000 mg | DELAYED_RELEASE_TABLET | Freq: Every day | ORAL | Status: DC | PRN
  Filled 2014-03-18: qty 1

## 2014-03-18 MED ORDER — DIAZEPAM 5 MG PO TABS
ORAL_TABLET | ORAL | Status: AC
  Filled 2014-03-18: qty 1

## 2014-03-18 MED ORDER — BUPIVACAINE-EPINEPHRINE 0.25% -1:200000 IJ SOLN
INTRAMUSCULAR | Status: DC | PRN
  Administered 2014-03-18: 3 mL

## 2014-03-18 MED ORDER — ONDANSETRON HCL 4 MG/2ML IJ SOLN
4.0000 mg | Freq: Once | INTRAMUSCULAR | Status: AC
  Administered 2014-03-18: 4 mg via INTRAVENOUS

## 2014-03-18 MED ORDER — FLEET ENEMA 7-19 GM/118ML RE ENEM
1.0000 | ENEMA | Freq: Once | RECTAL | Status: AC | PRN
  Filled 2014-03-18: qty 1

## 2014-03-18 MED ORDER — SUCCINYLCHOLINE CHLORIDE 20 MG/ML IJ SOLN
INTRAMUSCULAR | Status: AC
  Filled 2014-03-18: qty 1

## 2014-03-18 MED ORDER — VECURONIUM BROMIDE 10 MG IV SOLR
INTRAVENOUS | Status: AC
  Filled 2014-03-18: qty 20

## 2014-03-18 MED ORDER — ONDANSETRON HCL 4 MG/2ML IJ SOLN
INTRAMUSCULAR | Status: AC
  Filled 2014-03-18: qty 4

## 2014-03-18 MED ORDER — DEXAMETHASONE SODIUM PHOSPHATE 10 MG/ML IJ SOLN
INTRAMUSCULAR | Status: AC
  Filled 2014-03-18: qty 2

## 2014-03-18 MED ORDER — VECURONIUM BROMIDE 10 MG IV SOLR
INTRAVENOUS | Status: DC | PRN
  Administered 2014-03-18: 5 mg via INTRAVENOUS
  Administered 2014-03-18: 10 mg via INTRAVENOUS

## 2014-03-18 MED ORDER — DIAZEPAM 5 MG PO TABS
5.0000 mg | ORAL_TABLET | Freq: Four times a day (QID) | ORAL | Status: DC | PRN
  Administered 2014-03-18 – 2014-03-19 (×3): 5 mg via ORAL
  Filled 2014-03-18 (×3): qty 1

## 2014-03-18 MED ORDER — PROPOFOL 10 MG/ML IV BOLUS
INTRAVENOUS | Status: DC | PRN
  Administered 2014-03-18: 310 mg via INTRAVENOUS
  Administered 2014-03-18: 90 mg via INTRAVENOUS

## 2014-03-18 MED ORDER — GLYCOPYRROLATE 0.2 MG/ML IJ SOLN
INTRAMUSCULAR | Status: DC | PRN
  Administered 2014-03-18: 0.6 mg via INTRAVENOUS

## 2014-03-18 MED ORDER — DEXAMETHASONE SODIUM PHOSPHATE 10 MG/ML IJ SOLN
INTRAMUSCULAR | Status: DC | PRN
  Administered 2014-03-18: 10 mg via INTRAVENOUS

## 2014-03-18 MED ORDER — SUCCINYLCHOLINE CHLORIDE 20 MG/ML IJ SOLN
INTRAMUSCULAR | Status: DC | PRN
  Administered 2014-03-18: 160 mg via INTRAVENOUS

## 2014-03-18 MED ORDER — NEOSTIGMINE METHYLSULFATE 10 MG/10ML IV SOLN
INTRAVENOUS | Status: DC | PRN
Start: 2014-03-18 — End: 2014-03-18
  Administered 2014-03-18: 5 mg via INTRAVENOUS

## 2014-03-18 MED ORDER — NEOSTIGMINE METHYLSULFATE 10 MG/10ML IV SOLN
INTRAVENOUS | Status: AC
  Filled 2014-03-18: qty 1

## 2014-03-18 SURGICAL SUPPLY — 77 items
APL SKNCLS STERI-STRIP NONHPOA (GAUZE/BANDAGES/DRESSINGS) ×1
BENZOIN TINCTURE PRP APPL 2/3 (GAUZE/BANDAGES/DRESSINGS) ×3 IMPLANT
BIT DRILL NEURO 2X3.1 SFT TUCH (MISCELLANEOUS) ×1 IMPLANT
BIT DRILL SRG 14X2.2XFLT CHK (BIT) IMPLANT
BIT DRL SRG 14X2.2XFLT CHK (BIT) ×1
BLADE SURG 15 STRL LF DISP TIS (BLADE) ×1 IMPLANT
BLADE SURG 15 STRL SS (BLADE)
BLADE SURG ROTATE 9660 (MISCELLANEOUS) ×3 IMPLANT
BUR MATCHSTICK NEURO 3.0 LAGG (BURR) IMPLANT
CANISTER SUCTION 2500CC (MISCELLANEOUS) ×2 IMPLANT
CARTRIDGE OIL MAESTRO DRILL (MISCELLANEOUS) ×1 IMPLANT
CERVICAL PARALLEL MED 7MM (Bone Implant) ×4 IMPLANT
CLOSURE WOUND 1/2 X4 (GAUZE/BANDAGES/DRESSINGS) ×1
COLLAR CERV LO CONTOUR FIRM DE (SOFTGOODS) IMPLANT
CORDS BIPOLAR (ELECTRODE) ×3 IMPLANT
COVER SURGICAL LIGHT HANDLE (MISCELLANEOUS) ×3 IMPLANT
CRADLE DONUT ADULT HEAD (MISCELLANEOUS) ×3 IMPLANT
DIFFUSER DRILL AIR PNEUMATIC (MISCELLANEOUS) ×3 IMPLANT
DRAIN JACKSON RD 7FR 3/32 (WOUND CARE) IMPLANT
DRAPE C-ARM 42X72 X-RAY (DRAPES) ×3 IMPLANT
DRAPE POUCH INSTRU U-SHP 10X18 (DRAPES) ×5 IMPLANT
DRAPE PROXIMA HALF (DRAPES) ×2 IMPLANT
DRAPE SURG 17X23 STRL (DRAPES) ×9 IMPLANT
DRILL BIT SKYLINE 14MM (BIT) ×3
DRILL NEURO 2X3.1 SOFT TOUCH (MISCELLANEOUS) ×3
DURAPREP 26ML APPLICATOR (WOUND CARE) ×3 IMPLANT
ELECT CAUTERY BLADE 6.4 (BLADE) ×2 IMPLANT
ELECT COATED BLADE 2.86 ST (ELECTRODE) ×3 IMPLANT
ELECT REM PT RETURN 9FT ADLT (ELECTROSURGICAL) ×3
ELECTRODE REM PT RTRN 9FT ADLT (ELECTROSURGICAL) ×1 IMPLANT
EVACUATOR SILICONE 100CC (DRAIN) IMPLANT
GAUZE SPONGE 4X4 12PLY STRL (GAUZE/BANDAGES/DRESSINGS) ×3 IMPLANT
GAUZE SPONGE 4X4 16PLY XRAY LF (GAUZE/BANDAGES/DRESSINGS) ×3 IMPLANT
GLOVE BIO SURGEON STRL SZ7 (GLOVE) ×3 IMPLANT
GLOVE BIO SURGEON STRL SZ8 (GLOVE) ×3 IMPLANT
GLOVE BIOGEL PI IND STRL 7.5 (GLOVE) ×2 IMPLANT
GLOVE BIOGEL PI IND STRL 8 (GLOVE) ×1 IMPLANT
GLOVE BIOGEL PI INDICATOR 7.5 (GLOVE) ×4
GLOVE BIOGEL PI INDICATOR 8 (GLOVE) ×2
GOWN STRL REUS W/ TWL LRG LVL3 (GOWN DISPOSABLE) ×1 IMPLANT
GOWN STRL REUS W/ TWL XL LVL3 (GOWN DISPOSABLE) ×1 IMPLANT
GOWN STRL REUS W/TWL LRG LVL3 (GOWN DISPOSABLE) ×3
GOWN STRL REUS W/TWL XL LVL3 (GOWN DISPOSABLE) ×3
IV CATH 14GX2 1/4 (CATHETERS) ×3 IMPLANT
KIT BASIN OR (CUSTOM PROCEDURE TRAY) ×3 IMPLANT
KIT ROOM TURNOVER OR (KITS) ×3 IMPLANT
MANIFOLD NEPTUNE II (INSTRUMENTS) ×1 IMPLANT
MARKING PEN WITH RULLER CORRECT SITE ×2 IMPLANT
NDL SPNL 20GX3.5 QUINCKE YW (NEEDLE) ×1 IMPLANT
NEEDLE 27GAX1X1/2 (NEEDLE) ×3 IMPLANT
NEEDLE SPNL 20GX3.5 QUINCKE YW (NEEDLE) ×3 IMPLANT
NS IRRIG 1000ML POUR BTL (IV SOLUTION) ×3 IMPLANT
OIL CARTRIDGE MAESTRO DRILL (MISCELLANEOUS) ×3
PACK ORTHO CERVICAL (CUSTOM PROCEDURE TRAY) ×3 IMPLANT
PAD ARMBOARD 7.5X6 YLW CONV (MISCELLANEOUS) ×6 IMPLANT
PATTIES SURGICAL .5 X.5 (GAUZE/BANDAGES/DRESSINGS) IMPLANT
PATTIES SURGICAL .5 X1 (DISPOSABLE) IMPLANT
PIN DISTRACTION 14 (PIN) ×4 IMPLANT
PLATE SKYLINE 2 LEVEL 34MM (Plate) ×2 IMPLANT
PUTTY BONE DBX 2.5 MIS (Bone Implant) ×2 IMPLANT
SCREW VAR SELF TAP SKYLINE 14M (Screw) ×12 IMPLANT
SPONGE INTESTINAL PEANUT (DISPOSABLE) ×3 IMPLANT
SPONGE SURGIFOAM ABS GEL 100 (HEMOSTASIS) ×3 IMPLANT
STRIP CLOSURE SKIN 1/2X4 (GAUZE/BANDAGES/DRESSINGS) ×2 IMPLANT
SURGIFLO TRUKIT (HEMOSTASIS) IMPLANT
SUT MNCRL AB 4-0 PS2 18 (SUTURE) ×3 IMPLANT
SUT SILK 4 0 (SUTURE)
SUT SILK 4-0 18XBRD TIE 12 (SUTURE) IMPLANT
SUT VIC AB 2-0 CT2 18 VCP726D (SUTURE) ×3 IMPLANT
SYR BULB IRRIGATION 50ML (SYRINGE) ×1 IMPLANT
SYR CONTROL 10ML LL (SYRINGE) ×6 IMPLANT
TAPE CLOTH 4X10 WHT NS (GAUZE/BANDAGES/DRESSINGS) ×3 IMPLANT
TAPE UMBILICAL COTTON 1/8X30 (MISCELLANEOUS) ×3 IMPLANT
TOWEL OR 17X24 6PK STRL BLUE (TOWEL DISPOSABLE) ×3 IMPLANT
TOWEL OR 17X26 10 PK STRL BLUE (TOWEL DISPOSABLE) ×3 IMPLANT
WATER STERILE IRR 1000ML POUR (IV SOLUTION) ×1 IMPLANT
YANKAUER SUCT BULB TIP NO VENT (SUCTIONS) ×3 IMPLANT

## 2014-03-18 NOTE — Progress Notes (Signed)
Orthopedic Tech Progress Note Patient Details:  Kevin Beck 09-04-1965 [id number]  Ortho Devices Type of Ortho Device: [place] cervical collar Ortho Device/Splint Interventions: Criss Alvine 03/18/2014, 9:09 PM

## 2014-03-18 NOTE — Plan of Care (Signed)
Problem: Consults Goal: Diagnosis - Spinal Surgery Outcome: Completed/Met Date Met:  03/18/14 Cervical Spine Fusion

## 2014-03-18 NOTE — Anesthesia Postprocedure Evaluation (Signed)
  Anesthesia Post-op Note  Patient: Kevin Beck  Procedure(s) Performed: Procedure(s) (LRB): ANTERIOR CERVICAL DECOMPRESSION/DISCECTOMY FUSION 2 LEVELS (N/A)  Patient Location: PACU  Anesthesia Type: General  Level of Consciousness: awake and alert   Airway and Oxygen Therapy: Patient Spontanous Breathing  Post-op Pain: mild  Post-op Assessment: Post-op Vital signs reviewed, Patient's Cardiovascular Status Stable, Respiratory Function Stable, Patent Airway and No signs of Nausea or vomiting  Last Vitals:  Filed Vitals:   03/18/14 1200  BP: 122/68  Pulse: 82  Temp:   Resp: 18    Post-op Vital Signs: stable   Complications: No apparent anesthesia complications

## 2014-03-18 NOTE — Anesthesia Preprocedure Evaluation (Signed)
Anesthesia Evaluation  Patient identified by MRN, date of birth, ID band Patient awake    Reviewed: Allergy & Precautions, NPO status , Patient's Chart, lab work & pertinent test results  Airway Mallampati: II  TM Distance: >3 FB Neck ROM: Limited    Dental no notable dental hx.    Pulmonary Current Smoker,  breath sounds clear to auscultation  Pulmonary exam normal       Cardiovascular negative cardio ROS  Rhythm:Regular Rate:Normal     Neuro/Psych negative neurological ROS  negative psych ROS   GI/Hepatic negative GI ROS, Neg liver ROS,   Endo/Other  negative endocrine ROS  Renal/GU negative Renal ROS  negative genitourinary   Musculoskeletal negative musculoskeletal ROS (+)   Abdominal   Peds negative pediatric ROS (+)  Hematology negative hematology ROS (+)   Anesthesia Other Findings   Reproductive/Obstetrics negative OB ROS                             Anesthesia Physical Anesthesia Plan  ASA: II  Anesthesia Plan: General   Post-op Pain Management:    Induction: Intravenous  Airway Management Planned: Oral ETT  Additional Equipment:   Intra-op Plan:   Post-operative Plan: Extubation in OR  Informed Consent: I have reviewed the patients History and Physical, chart, labs and discussed the procedure including the risks, benefits and alternatives for the proposed anesthesia with the patient or authorized representative who has indicated his/her understanding and acceptance.   Dental advisory given  Plan Discussed with: CRNA and Surgeon  Anesthesia Plan Comments:         Anesthesia Quick Evaluation

## 2014-03-18 NOTE — Anesthesia Procedure Notes (Signed)
Procedure Name: Intubation Date/Time: 03/18/2014 7:44 AM Performed by: Jacquiline Doe A Pre-anesthesia Checklist: Patient identified, Timeout performed, Emergency Drugs available, Suction available and Patient being monitored Patient Re-evaluated:Patient Re-evaluated prior to inductionOxygen Delivery Method: Circle system utilized Preoxygenation: Pre-oxygenation with 100% oxygen Intubation Type: IV induction and Cricoid Pressure applied Ventilation: Mask ventilation without difficulty Laryngoscope Size: Mac and 4 Grade View: Grade II Tube type: Oral Tube size: 8.5 mm Number of attempts: 1 Airway Equipment and Method: Stylet and LTA kit utilized Placement Confirmation: ETT inserted through vocal cords under direct vision,  breath sounds checked- equal and bilateral and positive ETCO2 Secured at: 24 cm Tube secured with: Tape Dental Injury: Teeth and Oropharynx as per pre-operative assessment

## 2014-03-18 NOTE — Transfer of Care (Signed)
Immediate Anesthesia Transfer of Care Note  Patient: Kevin Beck  Procedure(s) Performed: Procedure(s) with comments: ANTERIOR CERVICAL DECOMPRESSION/DISCECTOMY FUSION 2 LEVELS (N/A) - Anterior cervical decompression fusion, cervical 5-6, cervical 6-7 with instrumentation and allograft.  Patient Location: PACU  Anesthesia Type:General  Level of Consciousness: awake, sedated, patient cooperative and responds to stimulation  Airway & Oxygen Therapy: Patient Spontanous Breathing and Patient connected to nasal cannula oxygen  Post-op Assessment: Report given to PACU RN, Post -op Vital signs reviewed and stable, Patient moving all extremities and Patient moving all extremities X 4  Post vital signs: Reviewed and stable  Last Vitals:  Filed Vitals:   03/18/14 0636  BP: 135/78  Pulse: 63  Temp: 36.9 C  Resp: 20    Complications: No apparent anesthesia complications

## 2014-03-18 NOTE — Progress Notes (Signed)
ANTIBIOTIC CONSULT NOTE:  Pharmacy consulted to give one-time post op Vancomycin dose in 43 YOM who is s/p anterior cervical decompression and is penicillin allergic. Will given one time dose of Vancomycin 1250 mg IV 12 hours after pre-op dose. Pharmacy to sign off.   Albertina Parr, PharmD., BCPS Clinical Pharmacist Pager [phone]

## 2014-03-19 DIAGNOSIS — G9529 Other cord compression: Secondary | ICD-10-CM | POA: Diagnosis not present

## 2014-03-19 NOTE — Progress Notes (Signed)
Patient alert and oriented, mae's well, voiding adequate amount of urine, swallowing without difficulty, no c/o pain. Patient discharged home with family. Script and discharged instructions given to patient. Patient and family stated understanding of d/c instructions given and has an appointment with MD. Aisha Teven Mittman RN 
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
## 2014-03-19 NOTE — Progress Notes (Signed)
    Patient doing well, resolved pre-op arm.hand symptoms with exception of some residual numbness in R fingertips. Pts PO L hand symptoms likely from IV have resolved completely. Well controlled neck pain. Tolerating hard collar well, has eaten normal foods without difficulty and been up and to bathroom and ambulating hallways. 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

## 2014-03-22 ENCOUNTER — Encounter (HOSPITAL_COMMUNITY): Payer: Self-pay | Admitting: Orthopedic Surgery

## 2014-06-03 ENCOUNTER — Ambulatory Visit: Payer: PRIVATE HEALTH INSURANCE | Admitting: Nurse Practitioner

## 2014-07-06 ENCOUNTER — Other Ambulatory Visit: Payer: Self-pay | Admitting: Orthopedic Surgery

## 2014-07-13 ENCOUNTER — Encounter (HOSPITAL_COMMUNITY): Payer: Self-pay | Admitting: *Deleted

## 2014-07-13 MED ORDER — VANCOMYCIN HCL 10 G IV SOLR
1500.0000 mg | INTRAVENOUS | Status: AC
  Administered 2014-07-14: 500 mg via INTRAVENOUS
  Administered 2014-07-14: 1000 mg via INTRAVENOUS

## 2014-07-13 NOTE — Progress Notes (Signed)
Pt denies SOB, chest pain, and being under the care of a cardiologist. Pt denies having a stress test, echo and cardiac cath. Pt made aware to stop taking Aspirin, otc vitamins and herbal medications. Do not take any NSAIDs ie: Ibuprofen, Advil, Naproxen or any medication containing Aspirin. Pt verbalized understanding of all pre-op instructions.

## 2014-07-14 ENCOUNTER — Ambulatory Visit (HOSPITAL_COMMUNITY): Payer: Worker's Compensation

## 2014-07-14 ENCOUNTER — Ambulatory Visit (HOSPITAL_COMMUNITY)
Admission: RE | Admit: 2014-07-14 | Discharge: 2014-07-14 | Disposition: A | Discharge status: Home / Self Care | Payer: Worker's Compensation | Source: Ambulatory Visit | Attending: Orthopedic Surgery | Admitting: Orthopedic Surgery

## 2014-07-14 ENCOUNTER — Encounter (HOSPITAL_COMMUNITY)
Admission: RE | Disposition: A | Discharge status: Home / Self Care | Payer: Self-pay | Source: Ambulatory Visit | Attending: Orthopedic Surgery

## 2014-07-14 ENCOUNTER — Ambulatory Visit (HOSPITAL_COMMUNITY): Payer: Worker's Compensation | Admitting: Anesthesiology

## 2014-07-14 ENCOUNTER — Encounter (HOSPITAL_COMMUNITY): Payer: Self-pay | Admitting: Radiology

## 2014-07-14 DIAGNOSIS — Z419 Encounter for procedure for purposes other than remedying health state, unspecified: Secondary | ICD-10-CM

## 2014-07-14 DIAGNOSIS — M5127 Other intervertebral disc displacement, lumbosacral region: Secondary | ICD-10-CM | POA: Insufficient documentation

## 2014-07-14 DIAGNOSIS — Z01818 Encounter for other preprocedural examination: Secondary | ICD-10-CM

## 2014-07-14 DIAGNOSIS — F17210 Nicotine dependence, cigarettes, uncomplicated: Secondary | ICD-10-CM | POA: Insufficient documentation

## 2014-07-14 HISTORY — PX: LUMBAR LAMINECTOMY/DECOMPRESSION MICRODISCECTOMY: SHX5026

## 2014-07-14 LAB — CBC WITH DIFFERENTIAL/PLATELET
BASOS PCT: 1 % (ref 0–1)
Basophils Absolute: 0.1 10*3/uL (ref 0.0–0.1)
EOS PCT: 4 % (ref 0–5)
Eosinophils Absolute: 0.2 10*3/uL (ref 0.0–0.7)
HCT: 44.9 % (ref 39.0–52.0)
Hemoglobin: 15.6 g/dL (ref 13.0–17.0)
LYMPHS ABS: 1.8 10*3/uL (ref 0.7–4.0)
Lymphocytes Relative: 34 % (ref 12–46)
MCH: 30.5 pg (ref 26.0–34.0)
MCHC: 34.7 g/dL (ref 30.0–36.0)
MCV: 87.7 fL (ref 78.0–100.0)
MONO ABS: 0.4 10*3/uL (ref 0.1–1.0)
Monocytes Relative: 8 % (ref 3–12)
NEUTROS ABS: 2.8 10*3/uL (ref 1.7–7.7)
Neutrophils Relative %: 53 % (ref 43–77)
Platelets: 159 10*3/uL (ref 150–400)
RBC: 5.12 MIL/uL (ref 4.22–5.81)
RDW: 13 % (ref 11.5–15.5)
WBC: 5.2 10*3/uL (ref 4.0–10.5)

## 2014-07-14 LAB — COMPREHENSIVE METABOLIC PANEL
ALK PHOS: 102 U/L (ref 38–126)
ALT: 35 U/L (ref 17–63)
ANION GAP: 7 (ref 5–15)
AST: 21 U/L (ref 15–41)
Albumin: 3.4 g/dL — ABNORMAL LOW (ref 3.5–5.0)
BUN: 11 mg/dL (ref 6–20)
CO2: 25 mmol/L (ref 22–32)
Calcium: 8.7 mg/dL — ABNORMAL LOW (ref 8.9–10.3)
Chloride: 108 mmol/L (ref 101–111)
Creatinine, Ser: 0.87 mg/dL (ref 0.61–1.24)
GFR calc Af Amer: >60 mL/min (ref >60)
GFR calc non Af Amer: >60 mL/min (ref >60)
Glucose, Bld: 97 mg/dL (ref 65–99)
POTASSIUM: 4 mmol/L (ref 3.5–5.1)
SODIUM: 140 mmol/L (ref 135–145)
Total Bilirubin: 0.6 mg/dL (ref 0.3–1.2)
Total Protein: 6.5 g/dL (ref 6.5–8.1)

## 2014-07-14 LAB — URINALYSIS, ROUTINE W REFLEX MICROSCOPIC
BILIRUBIN URINE: NEGATIVE
Glucose, UA: NEGATIVE mg/dL
HGB URINE DIPSTICK: NEGATIVE
Ketones, ur: NEGATIVE mg/dL
Leukocytes, UA: NEGATIVE
NITRITE: NEGATIVE
Protein, ur: NEGATIVE mg/dL
SPECIFIC GRAVITY, URINE: 1.026 (ref 1.005–1.030)
Urobilinogen, UA: 0.2 mg/dL (ref 0.0–1.0)
pH: 5.5 (ref 5.0–8.0)

## 2014-07-14 LAB — SURGICAL PCR SCREEN
MRSA, PCR: NEGATIVE
STAPHYLOCOCCUS AUREUS: NEGATIVE

## 2014-07-14 LAB — PROTIME-INR
INR: 1.04 (ref 0.00–1.49)
PROTHROMBIN TIME: 13.8 s (ref 11.6–15.2)

## 2014-07-14 LAB — APTT: APTT: 32 s (ref 24–37)

## 2014-07-14 SURGERY — LUMBAR LAMINECTOMY/DECOMPRESSION MICRODISCECTOMY
Anesthesia: General | Site: Spine Lumbar | Laterality: Right

## 2014-07-14 MED ORDER — BUPIVACAINE-EPINEPHRINE (PF) 0.25% -1:200000 IJ SOLN
INTRAMUSCULAR | Status: AC
  Filled 2014-07-14: qty 30

## 2014-07-14 MED ORDER — DEXAMETHASONE SODIUM PHOSPHATE 10 MG/ML IJ SOLN
INTRAMUSCULAR | Status: DC | PRN
  Administered 2014-07-14: 10 mg via INTRAVENOUS

## 2014-07-14 MED ORDER — METHYLPREDNISOLONE ACETATE 40 MG/ML IJ SUSP
INTRAMUSCULAR | Status: AC
  Filled 2014-07-14: qty 1

## 2014-07-14 MED ORDER — MIDAZOLAM HCL 2 MG/2ML IJ SOLN
INTRAMUSCULAR | Status: AC
  Filled 2014-07-14: qty 2

## 2014-07-14 MED ORDER — PROMETHAZINE HCL 25 MG/ML IJ SOLN: 6.2500 mg | INTRAMUSCULAR | Status: DC | PRN

## 2014-07-14 MED ORDER — SURGIFOAM 100 EX MISC
CUTANEOUS | Status: DC | PRN
  Administered 2014-07-14: 16:00:00 via TOPICAL

## 2014-07-14 MED ORDER — NEOSTIGMINE METHYLSULFATE 10 MG/10ML IV SOLN
INTRAVENOUS | Status: DC | PRN
  Administered 2014-07-14: 4 mg via INTRAVENOUS

## 2014-07-14 MED ORDER — THROMBIN 20000 UNITS EX SOLR
CUTANEOUS | Status: AC
  Filled 2014-07-14: qty 20000

## 2014-07-14 MED ORDER — SUCCINYLCHOLINE CHLORIDE 20 MG/ML IJ SOLN
INTRAMUSCULAR | Status: DC | PRN
  Administered 2014-07-14: 140 mg via INTRAVENOUS

## 2014-07-14 MED ORDER — BUPIVACAINE-EPINEPHRINE 0.25% -1:200000 IJ SOLN
INTRAMUSCULAR | Status: DC | PRN
  Administered 2014-07-14: 10 mL
  Administered 2014-07-14: 5 mL

## 2014-07-14 MED ORDER — LACTATED RINGERS IV SOLN
INTRAVENOUS | Status: DC
  Administered 2014-07-14 (×3): via INTRAVENOUS

## 2014-07-14 MED ORDER — LIDOCAINE HCL (CARDIAC) 20 MG/ML IV SOLN
INTRAVENOUS | Status: DC | PRN
  Administered 2014-07-14: 100 mg via INTRAVENOUS

## 2014-07-14 MED ORDER — DEXAMETHASONE SODIUM PHOSPHATE 10 MG/ML IJ SOLN
INTRAMUSCULAR | Status: AC
  Filled 2014-07-14: qty 1

## 2014-07-14 MED ORDER — PROPOFOL 10 MG/ML IV BOLUS
INTRAVENOUS | Status: DC | PRN
  Administered 2014-07-14: 200 mg via INTRAVENOUS

## 2014-07-14 MED ORDER — FENTANYL CITRATE (PF) 250 MCG/5ML IJ SOLN
INTRAMUSCULAR | Status: AC
  Filled 2014-07-14: qty 5

## 2014-07-14 MED ORDER — VANCOMYCIN HCL IN DEXTROSE 1-5 GM/200ML-% IV SOLN
INTRAVENOUS | Status: AC
  Filled 2014-07-14: qty 200

## 2014-07-14 MED ORDER — ROCURONIUM BROMIDE 100 MG/10ML IV SOLN
INTRAVENOUS | Status: DC | PRN
  Administered 2014-07-14: 50 mg via INTRAVENOUS

## 2014-07-14 MED ORDER — VANCOMYCIN HCL 500 MG IV SOLR
500.0000 mg | INTRAVENOUS | Status: DC
  Filled 2014-07-14 (×2): qty 500

## 2014-07-14 MED ORDER — FENTANYL CITRATE (PF) 100 MCG/2ML IJ SOLN
INTRAMUSCULAR | Status: DC | PRN
  Administered 2014-07-14 (×3): 50 ug via INTRAVENOUS
  Administered 2014-07-14: 200 ug via INTRAVENOUS
  Administered 2014-07-14: 50 ug via INTRAVENOUS
  Administered 2014-07-14: 100 ug via INTRAVENOUS

## 2014-07-14 MED ORDER — MUPIROCIN 2 % EX OINT
1.0000 "application " | TOPICAL_OINTMENT | Freq: Once | CUTANEOUS | Status: AC
  Administered 2014-07-14: 1 via TOPICAL
  Filled 2014-07-14: qty 22

## 2014-07-14 MED ORDER — ARTIFICIAL TEARS OP OINT
TOPICAL_OINTMENT | OPHTHALMIC | Status: DC | PRN
  Administered 2014-07-14: 1 via OPHTHALMIC

## 2014-07-14 MED ORDER — POVIDONE-IODINE 7.5 % EX SOLN
Freq: Once | CUTANEOUS | Status: DC
  Filled 2014-07-14: qty 118

## 2014-07-14 MED ORDER — ONDANSETRON HCL 4 MG/2ML IJ SOLN
INTRAMUSCULAR | Status: DC | PRN
  Administered 2014-07-14: 4 mg via INTRAVENOUS

## 2014-07-14 MED ORDER — METHYLENE BLUE 1 % INJ SOLN
INTRAMUSCULAR | Status: AC
  Filled 2014-07-14: qty 10

## 2014-07-14 MED ORDER — METHYLENE BLUE 1 % INJ SOLN
INTRAMUSCULAR | Status: DC | PRN
  Administered 2014-07-14: .5 mL via INTRADERMAL

## 2014-07-14 MED ORDER — METHYLPREDNISOLONE ACETATE 40 MG/ML IJ SUSP
INTRAMUSCULAR | Status: DC | PRN
  Administered 2014-07-14: 40 mg

## 2014-07-14 MED ORDER — 0.9 % SODIUM CHLORIDE (POUR BTL) OPTIME
TOPICAL | Status: DC | PRN
  Administered 2014-07-14: 1000 mL

## 2014-07-14 MED ORDER — MIDAZOLAM HCL 5 MG/5ML IJ SOLN
INTRAMUSCULAR | Status: DC | PRN
  Administered 2014-07-14: 2 mg via INTRAVENOUS

## 2014-07-14 MED ORDER — GLYCOPYRROLATE 0.2 MG/ML IJ SOLN
INTRAMUSCULAR | Status: DC | PRN
  Administered 2014-07-14: .8 mg via INTRAVENOUS

## 2014-07-14 MED ORDER — HEMOSTATIC AGENTS (NO CHARGE) OPTIME
TOPICAL | Status: DC | PRN
  Administered 2014-07-14: 1 via TOPICAL

## 2014-07-14 MED ORDER — HYDROMORPHONE HCL 1 MG/ML IJ SOLN: 0.2500 mg | INTRAMUSCULAR | Status: DC | PRN

## 2014-07-14 MED ORDER — VECURONIUM BROMIDE 10 MG IV SOLR
INTRAVENOUS | Status: DC | PRN
  Administered 2014-07-14 (×2): 2 mg via INTRAVENOUS
  Administered 2014-07-14: 1 mg via INTRAVENOUS

## 2014-07-14 SURGICAL SUPPLY — 75 items
APL SKNCLS STERI-STRIP NONHPOA (GAUZE/BANDAGES/DRESSINGS) ×1
BENZOIN TINCTURE PRP APPL 2/3 (GAUZE/BANDAGES/DRESSINGS) ×2 IMPLANT
BUR ROUND PRECISION 4.0 (BURR) ×2 IMPLANT
BUR ROUND PRECISION 4.0MM (BURR) ×1
CANISTER SUCTION 2500CC (MISCELLANEOUS) ×3 IMPLANT
CARTRIDGE OIL MAESTRO DRILL (MISCELLANEOUS) ×1 IMPLANT
CLOSURE WOUND 1/2 X4 (GAUZE/BANDAGES/DRESSINGS) ×1
CORDS BIPOLAR (ELECTRODE) ×3 IMPLANT
COVER SURGICAL LIGHT HANDLE (MISCELLANEOUS) ×3 IMPLANT
DIFFUSER DRILL AIR PNEUMATIC (MISCELLANEOUS) ×3 IMPLANT
DRAIN CHANNEL 15F RND FF W/TCR (WOUND CARE) IMPLANT
DRAPE POUCH INSTRU U-SHP 10X18 (DRAPES) ×4 IMPLANT
DRAPE SURG 17X23 STRL (DRAPES) ×12 IMPLANT
DURAPREP 26ML APPLICATOR (WOUND CARE) ×3 IMPLANT
ELECT BLADE 4.0 EZ CLEAN MEGAD (MISCELLANEOUS) ×3
ELECT CAUTERY BLADE 6.4 (BLADE) ×3 IMPLANT
ELECT REM PT RETURN 9FT ADLT (ELECTROSURGICAL) ×3
ELECTRODE BLDE 4.0 EZ CLN MEGD (MISCELLANEOUS) IMPLANT
ELECTRODE REM PT RTRN 9FT ADLT (ELECTROSURGICAL) ×1 IMPLANT
EVACUATOR SILICONE 100CC (DRAIN) IMPLANT
FILTER STRAW FLUID ASPIR (MISCELLANEOUS) ×5 IMPLANT
GAUZE SPONGE 4X4 12PLY STRL (GAUZE/BANDAGES/DRESSINGS) ×3 IMPLANT
GAUZE SPONGE 4X4 16PLY XRAY LF (GAUZE/BANDAGES/DRESSINGS) ×4 IMPLANT
GLOVE BIO SURGEON STRL SZ 6.5 (GLOVE) ×1 IMPLANT
GLOVE BIO SURGEON STRL SZ7 (GLOVE) ×5 IMPLANT
GLOVE BIO SURGEON STRL SZ8 (GLOVE) ×3 IMPLANT
GLOVE BIO SURGEONS STRL SZ 6.5 (GLOVE) ×1
GLOVE BIOGEL PI IND STRL 7.0 (GLOVE) ×1 IMPLANT
GLOVE BIOGEL PI IND STRL 8 (GLOVE) ×1 IMPLANT
GLOVE BIOGEL PI INDICATOR 7.0 (GLOVE) ×4
GLOVE BIOGEL PI INDICATOR 8 (GLOVE) ×2
GOWN STRL REUS W/ TWL LRG LVL3 (GOWN DISPOSABLE) ×1 IMPLANT
GOWN STRL REUS W/ TWL XL LVL3 (GOWN DISPOSABLE) ×2 IMPLANT
GOWN STRL REUS W/TWL LRG LVL3 (GOWN DISPOSABLE) ×12
GOWN STRL REUS W/TWL XL LVL3 (GOWN DISPOSABLE) ×3
IV CATH 14GX2 1/4 (CATHETERS) ×3 IMPLANT
KIT BASIN OR (CUSTOM PROCEDURE TRAY) ×3 IMPLANT
KIT POSITION SURG JACKSON T1 (MISCELLANEOUS) ×3 IMPLANT
KIT ROOM TURNOVER OR (KITS) ×3 IMPLANT
NDL 18GX1X1/2 (RX/OR ONLY) (NEEDLE) ×1 IMPLANT
NDL HYPO 25GX1X1/2 BEV (NEEDLE) ×1 IMPLANT
NDL SPNL 18GX3.5 QUINCKE PK (NEEDLE) ×2 IMPLANT
NEEDLE 18GX1X1/2 (RX/OR ONLY) (NEEDLE) ×3 IMPLANT
NEEDLE 22X1 1/2 (OR ONLY) (NEEDLE) ×3 IMPLANT
NEEDLE HYPO 25GX1X1/2 BEV (NEEDLE) ×3 IMPLANT
NEEDLE SPNL 18GX3.5 QUINCKE PK (NEEDLE) ×6 IMPLANT
NS IRRIG 1000ML POUR BTL (IV SOLUTION) ×3 IMPLANT
OIL CARTRIDGE MAESTRO DRILL (MISCELLANEOUS) ×3
PACK LAMINECTOMY ORTHO (CUSTOM PROCEDURE TRAY) ×3 IMPLANT
PACK UNIVERSAL I (CUSTOM PROCEDURE TRAY) ×3 IMPLANT
PAD ARMBOARD 7.5X6 YLW CONV (MISCELLANEOUS) ×8 IMPLANT
PATTIES SURGICAL .5 X.5 (GAUZE/BANDAGES/DRESSINGS) IMPLANT
PATTIES SURGICAL .5 X1 (DISPOSABLE) ×3 IMPLANT
SPONGE INTESTINAL PEANUT (DISPOSABLE) ×1 IMPLANT
SPONGE SURGIFOAM ABS GEL 100 (HEMOSTASIS) ×3 IMPLANT
SPONGE SURGIFOAM ABS GEL SZ50 (HEMOSTASIS) ×1 IMPLANT
STRIP CLOSURE SKIN 1/2X4 (GAUZE/BANDAGES/DRESSINGS) ×1 IMPLANT
SURGIFLO W/THROMBIN 8M KIT (HEMOSTASIS) ×2 IMPLANT
SUT BONE WAX W31G (SUTURE) ×2 IMPLANT
SUT MNCRL AB 4-0 PS2 18 (SUTURE) ×3 IMPLANT
SUT VIC AB 0 CT1 18XCR BRD 8 (SUTURE) IMPLANT
SUT VIC AB 0 CT1 27 (SUTURE)
SUT VIC AB 0 CT1 27XBRD ANBCTR (SUTURE) IMPLANT
SUT VIC AB 0 CT1 8-18 (SUTURE) ×3
SUT VIC AB 1 CT1 18XCR BRD 8 (SUTURE) ×1 IMPLANT
SUT VIC AB 1 CT1 8-18 (SUTURE) ×3
SUT VIC AB 2-0 CT2 18 VCP726D (SUTURE) ×3 IMPLANT
SYR 20CC LL (SYRINGE) IMPLANT
SYR BULB IRRIGATION 50ML (SYRINGE) ×3 IMPLANT
SYR CONTROL 10ML LL (SYRINGE) ×6 IMPLANT
SYR TB 1ML 26GX3/8 SAFETY (SYRINGE) ×6 IMPLANT
SYR TB 1ML LUER SLIP (SYRINGE) ×6 IMPLANT
TOWEL OR 17X24 6PK STRL BLUE (TOWEL DISPOSABLE) ×3 IMPLANT
TOWEL OR 17X26 10 PK STRL BLUE (TOWEL DISPOSABLE) ×3 IMPLANT
YANKAUER SUCT BULB TIP NO VENT (SUCTIONS) ×3 IMPLANT

## 2014-07-14 NOTE — Transfer of Care (Signed)
Immediate Anesthesia Transfer of Care Note  Patient: Kevin Beck  Procedure(s) Performed: Procedure(s) with comments: LUMBAR LAMINECTOMY/DECOMPRESSION MICRODISCECTOMY (Right) - Right sided lumbar 5-sacrum 1 microdisectomy  Patient Location: PACU  Anesthesia Type:General  Level of Consciousness: sedated  Airway & Oxygen Therapy: Patient Spontanous Breathing and Patient connected to nasal cannula oxygen  Post-op Assessment: Report given to RN and Post -op Vital signs reviewed and stable  Post vital signs: stable  Last Vitals:  Filed Vitals:   07/14/14 1818  BP: 131/66  Pulse:   Temp: 36.5 C  Resp:     Complications: No apparent anesthesia complications

## 2014-07-14 NOTE — Anesthesia Procedure Notes (Signed)
Date/Time: 07/14/2014 3:43 PM Performed by: Laretta Alstrom Pre-anesthesia Checklist: Patient identified, Emergency Drugs available, Suction available and Patient being monitored Patient Re-evaluated:Patient Re-evaluated prior to inductionOxygen Delivery Method: Circle system utilized Preoxygenation: Pre-oxygenation with 100% oxygen Intubation Type: IV induction Ventilation: Oral airway inserted - appropriate to patient size and Mask ventilation without difficulty Laryngoscope Size: Glidescope Grade View: Grade I Tube type: Oral Tube size: 8.0 mm Number of attempts: 1 Airway Equipment and Method: Stylet and Video-laryngoscopy Placement Confirmation: ETT inserted through vocal cords under direct vision,  positive ETCO2 and breath sounds checked- equal and bilateral Secured at: 25 cm Tube secured with: Tape Dental Injury: Teeth and Oropharynx as per pre-operative assessment  Difficulty Due To: Difficulty was anticipated and Difficult Airway- due to reduced neck mobility Comments: Easy airway with glidescope, neck mobility very limited after neck fusion.tb
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
## 2014-07-14 NOTE — Anesthesia Preprocedure Evaluation (Addendum)
Anesthesia Evaluation  Patient identified by MRN, date of birth, ID band Patient awake    Reviewed: Allergy & Precautions, NPO status , Patient's Chart, lab work & pertinent test results  Airway Mallampati: II  TM Distance: >3 FB Neck ROM: Limited  Mouth opening: Limited Mouth Opening  Dental   Pulmonary Current Smoker,  breath sounds clear to auscultation        Cardiovascular negative cardio ROS  Rhythm:Regular Rate:Normal     Neuro/Psych    GI/Hepatic negative GI ROS, Neg liver ROS,   Endo/Other  negative endocrine ROS  Renal/GU negative Renal ROS     Musculoskeletal   Abdominal (+) + obese,   Peds  Hematology   Anesthesia Other Findings   Reproductive/Obstetrics                           Anesthesia Physical Anesthesia Plan  ASA: III  Anesthesia Plan: General   Post-op Pain Management:    Induction: Intravenous  Airway Management Planned: Oral ETT and Video Laryngoscope Planned  Additional Equipment:   Intra-op Plan:   Post-operative Plan: Possible Post-op intubation/ventilation  Informed Consent: I have reviewed the patients History and Physical, chart, labs and discussed the procedure including the risks, benefits and alternatives for the proposed anesthesia with the patient or authorized representative who has indicated his/her understanding and acceptance.   Dental advisory given  Plan Discussed with: CRNA and Anesthesiologist  Anesthesia Plan Comments:        Anesthesia Quick Evaluation

## 2014-07-15 ENCOUNTER — Encounter (HOSPITAL_COMMUNITY): Payer: Self-pay | Admitting: Orthopedic Surgery
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
## 2014-07-15 NOTE — Anesthesia Postprocedure Evaluation (Signed)
Anesthesia Post Note  Patient: Kevin Beck  Procedure(s) Performed: Procedure(s) (LRB): LUMBAR LAMINECTOMY/DECOMPRESSION MICRODISCECTOMY (Right)  Anesthesia type: General  Patient location: PACU  Post pain: Pain level controlled and Adequate analgesia  Post assessment: Post-op Vital signs reviewed, Patient's Cardiovascular Status Stable, Respiratory Function Stable, Patent Airway and Pain level controlled  Last Vitals:  Filed Vitals:   07/14/14 1915  BP: 120/82  Pulse: 64  Temp: 36.8 C  Resp: 14    Post vital signs: Reviewed and stable  Level of consciousness: awake, alert  and oriented  Complications: No apparent anesthesia complications

## 2014-11-10 ENCOUNTER — Encounter: Payer: Self-pay | Admitting: Family

## 2014-11-10 ENCOUNTER — Ambulatory Visit (INDEPENDENT_AMBULATORY_CARE_PROVIDER_SITE_OTHER): Payer: PRIVATE HEALTH INSURANCE | Admitting: Family

## 2014-11-10 VITALS — BP 123/94 | HR 90 | Temp 97.3°F | Ht 79.0 in | Wt 306.6 lb

## 2014-11-10 DIAGNOSIS — G629 Polyneuropathy, unspecified: Secondary | ICD-10-CM | POA: Diagnosis not present

## 2014-11-10 DIAGNOSIS — M722 Plantar fascial fibromatosis: Secondary | ICD-10-CM

## 2014-11-10 MED ORDER — METHYLPREDNISOLONE ACETATE 80 MG/ML IJ SUSP
80.0000 mg | Freq: Once | INTRAMUSCULAR | Status: AC
  Administered 2014-11-10: 80 mg via INTRAMUSCULAR

## 2014-11-10 MED ORDER — KETOROLAC TROMETHAMINE 60 MG/2ML IM SOLN
60.0000 mg | Freq: Once | INTRAMUSCULAR | Status: AC
  Administered 2014-11-10: 60 mg via INTRAMUSCULAR

## 2014-11-10 MED ORDER — GABAPENTIN 100 MG PO CAPS: 100.0000 mg | ORAL_CAPSULE | Freq: Three times a day (TID) | ORAL | Status: DC

## 2014-11-10 MED ORDER — MELOXICAM 15 MG PO TABS: 15.0000 mg | ORAL_TABLET | Freq: Every day | ORAL | Status: DC

## 2014-11-10 NOTE — Patient Instructions (Addendum)
Plantar Fasciitis (Heel Spur Syndrome) with Rehab The plantar fascia is a fibrous, ligament-like, soft-tissue structure that spans the bottom of the foot. Plantar fasciitis is a condition that causes pain in the foot due to inflammation of the tissue. SYMPTOMS   Pain and tenderness on the underneath side of the foot.  Pain that worsens with standing or walking. CAUSES  Plantar fasciitis is caused by irritation and injury to the plantar fascia on the underneath side of the foot. Common mechanisms of injury include:  Direct trauma to bottom of the foot.  Damage to a small nerve that runs under the foot where the main fascia attaches to the heel bone.  Stress placed on the plantar fascia due to bone spurs. RISK INCREASES WITH:   Activities that place stress on the plantar fascia (running, jumping, pivoting, or cutting).  Poor strength and flexibility.  Improperly fitted shoes.  Tight calf muscles.  Flat feet.  Failure to warm-up properly before activity.  Obesity. PREVENTION  Warm up and stretch properly before activity.  Allow for adequate recovery between workouts.  Maintain physical fitness:  Strength, flexibility, and endurance.  Cardiovascular fitness.  Maintain a health body weight.  Avoid stress on the plantar fascia.  Wear properly fitted shoes, including arch supports for individuals who have flat feet. PROGNOSIS  If treated properly, then the symptoms of plantar fasciitis usually resolve without surgery. However, occasionally surgery is necessary. RELATED COMPLICATIONS   Recurrent symptoms that may result in a chronic condition.  Problems of the lower back that are caused by compensating for the injury, such as limping.  Pain or weakness of the foot during push-off following surgery.  Chronic inflammation, scarring, and partial or complete fascia tear, occurring more often from repeated injections. TREATMENT  Treatment initially involves the use of  ice and medication to help reduce pain and inflammation. The use of strengthening and stretching exercises may help reduce pain with activity, especially stretches of the Achilles tendon. These exercises may be performed at home or with a therapist. Your caregiver may recommend that you use heel cups of arch supports to help reduce stress on the plantar fascia. Occasionally, corticosteroid injections are given to reduce inflammation. If symptoms persist for greater than 6 months despite non-surgical (conservative), then surgery may be recommended.  MEDICATION   If pain medication is necessary, then nonsteroidal anti-inflammatory medications, such as aspirin and ibuprofen, or other minor pain relievers, such as acetaminophen, are often recommended.  Do not take pain medication within 7 days before surgery.  Prescription pain relievers may be given if deemed necessary by your caregiver. Use only as directed and only as much as you need.  Corticosteroid injections may be given by your caregiver. These injections should be reserved for the most serious cases, because they may only be given a certain number of times. HEAT AND COLD  Cold treatment (icing) relieves pain and reduces inflammation. Cold treatment should be applied for 10 to 15 minutes every 2 to 3 hours for inflammation and pain and immediately after any activity that aggravates your symptoms. Use ice packs or massage the area with a piece of ice (ice massage).  Heat treatment may be used prior to performing the stretching and strengthening activities prescribed by your caregiver, physical therapist, or athletic trainer. Use a heat pack or soak the injury in warm water. SEEK IMMEDIATE MEDICAL CARE IF:  Treatment seems to offer no benefit, or the condition worsens.  Any medications produce adverse side effects. EXERCISES RANGE   OF MOTION (ROM) AND STRETCHING EXERCISES - Plantar Fasciitis (Heel Spur Syndrome) These exercises may help you  when beginning to rehabilitate your injury. Your symptoms may resolve with or without further involvement from your physician, physical therapist or athletic trainer. While completing these exercises, remember:   Restoring tissue flexibility helps normal motion to return to the joints. This allows healthier, less painful movement and activity.  An effective stretch should be held for at least 30 seconds.  A stretch should never be painful. You should only feel a gentle lengthening or release in the stretched tissue. RANGE OF MOTION - Toe Extension, Flexion  Sit with your right / left leg crossed over your opposite knee.  Grasp your toes and gently pull them back toward the top of your foot. You should feel a stretch on the bottom of your toes and/or foot.  Hold this stretch for __________ seconds.  Now, gently pull your toes toward the bottom of your foot. You should feel a stretch on the top of your toes and or foot.  Hold this stretch for __________ seconds. Repeat __________ times. Complete this stretch __________ times per day.  RANGE OF MOTION - Ankle Dorsiflexion, Active Assisted  Remove shoes and sit on a chair that is preferably not on a carpeted surface.  Place right / left foot under knee. Extend your opposite leg for support.  Keeping your heel down, slide your right / left foot back toward the chair until you feel a stretch at your ankle or calf. If you do not feel a stretch, slide your bottom forward to the edge of the chair, while still keeping your heel down.  Hold this stretch for __________ seconds. Repeat __________ times. Complete this stretch __________ times per day.  STRETCH - Gastroc, Standing  Place hands on wall.  Extend right / left leg, keeping the front knee somewhat bent.  Slightly point your toes inward on your back foot.  Keeping your right / left heel on the floor and your knee straight, shift your weight toward the wall, not allowing your back to  arch.  You should feel a gentle stretch in the right / left calf. Hold this position for __________ seconds. Repeat __________ times. Complete this stretch __________ times per day. STRETCH - Soleus, Standing  Place hands on wall.  Extend right / left leg, keeping the other knee somewhat bent.  Slightly point your toes inward on your back foot.  Keep your right / left heel on the floor, bend your back knee, and slightly shift your weight over the back leg so that you feel a gentle stretch deep in your back calf.  Hold this position for __________ seconds. Repeat __________ times. Complete this stretch __________ times per day. STRETCH - Gastrocsoleus, Standing  Note: This exercise can place a lot of stress on your foot and ankle. Please complete this exercise only if specifically instructed by your caregiver.   Place the ball of your right / left foot on a step, keeping your other foot firmly on the same step.  Hold on to the wall or a rail for balance.  Slowly lift your other foot, allowing your body weight to press your heel down over the edge of the step.  You should feel a stretch in your right / left calf.  Hold this position for __________ seconds.  Repeat this exercise with a slight bend in your right / left knee. Repeat __________ times. Complete this stretch __________ times per day.    STRENGTHENING EXERCISES - Plantar Fasciitis (Heel Spur Syndrome)  These exercises may help you when beginning to rehabilitate your injury. They may resolve your symptoms with or without further involvement from your physician, physical therapist or athletic trainer. While completing these exercises, remember:   Muscles can gain both the endurance and the strength needed for everyday activities through controlled exercises.  Complete these exercises as instructed by your physician, physical therapist or athletic trainer. Progress the resistance and repetitions only as guided. STRENGTH -  Towel Curls  Sit in a chair positioned on a non-carpeted surface.  Place your foot on a towel, keeping your heel on the floor.  Pull the towel toward your heel by only curling your toes. Keep your heel on the floor.  If instructed by your physician, physical therapist or athletic trainer, add ____________________ at the end of the towel. Repeat __________ times. Complete this exercise __________ times per day. STRENGTH - Ankle Inversion  Secure one end of a rubber exercise band/tubing to a fixed object (table, pole). Loop the other end around your foot just before your toes.  Place your fists between your knees. This will focus your strengthening at your ankle.  Slowly, pull your big toe up and in, making sure the band/tubing is positioned to resist the entire motion.  Hold this position for __________ seconds.  Have your muscles resist the band/tubing as it slowly pulls your foot back to the starting position. Repeat __________ times. Complete this exercises __________ times per day.  Document Released: 02/05/2005 Document Revised: 04/30/2011 Document Reviewed: 05/20/2008 [facility] Patient Information 2015 [place], [place]. This information is not intended to replace advice given to you by your health care Ellis Mehaffey. Make sure you discuss any questions you have with your health care Rhylee Nunn. Peripheral Neuropathy Peripheral neuropathy is a type of nerve damage. It affects nerves that carry signals between the spinal cord and other parts of the body. These are called peripheral nerves. With peripheral neuropathy, one nerve or a group of nerves may be damaged.  CAUSES  Many things can damage peripheral nerves. For some people with peripheral neuropathy, the cause is unknown. Some causes include:  Diabetes. This is the most common cause of peripheral neuropathy.  Injury to a nerve.  Pressure or stress on a nerve that lasts a long time.  Too little vitamin B. Alcoholism can lead to  this.  Infections.  Autoimmune diseases, such as multiple sclerosis and systemic lupus erythematosus.  Inherited nerve diseases.  Some medicines, such as cancer drugs.  Toxic substances, such as lead and mercury.  Too little blood flowing to the legs.  Kidney disease.  Thyroid disease. SIGNS AND SYMPTOMS  Different people have different symptoms. The symptoms you have will depend on which of your nerves is damaged. Common symptoms include:  Loss of feeling (numbness) in the feet and hands.  Tingling in the feet and hands.  Pain that burns.  Very sensitive skin.  Weakness.  Not being able to move a part of the body (paralysis).  Muscle twitching.  Clumsiness or poor coordination.  Loss of balance.  Not being able to control your bladder.  Feeling dizzy.  Sexual problems. DIAGNOSIS  Peripheral neuropathy is a symptom, not a disease. Finding the cause of peripheral neuropathy can be hard. To figure that out, your health care Meenakshi Sazama will take a medical history and do a physical exam. A neurological exam will also be done. This involves checking things affected by your brain, spinal cord, and nerves (  nervous system). For example, your health care Olander Friedl will check your reflexes, how you move, and what you can feel.  Other types of tests may also be ordered, such as:  Blood tests.  A test of the fluid in your spinal cord.  Imaging tests, such as CT scans or an MRI.  Electromyography (EMG). This test checks the nerves that control muscles.  Nerve conduction velocity tests. These tests check how fast messages pass through your nerves.  Nerve biopsy. A small piece of nerve is removed. It is then checked under a microscope. TREATMENT   Medicine is often used to treat peripheral neuropathy. Medicines may include:  Pain-relieving medicines. Prescription or over-the-counter medicine may be suggested.  Antiseizure medicine. This may be used for  pain.  Antidepressants. These also may help ease pain from neuropathy.  Lidocaine. This is a numbing medicine. You might wear a patch or be given a shot.  Mexiletine. This medicine is typically used to help control irregular heart rhythms.  Surgery. Surgery may be needed to relieve pressure on a nerve or to destroy a nerve that is causing pain.  Physical therapy to help movement.  Assistive devices to help movement. HOME CARE INSTRUCTIONS   Only take over-the-counter or prescription medicines as directed by your health care Dalylah Ramey. Follow the instructions carefully for any given medicines. Do not take any other medicines without first getting approval from your health care Sarahi Borland.  If you have diabetes, work closely with your health care Darline Faith to keep your blood sugar under control.  If you have numbness in your feet:  Check every day for signs of injury or infection. Watch for redness, warmth, and swelling.  Wear padded socks and comfortable shoes. These help protect your feet.  Do not do things that put pressure on your damaged nerve.  Do not smoke. Smoking keeps blood from getting to damaged nerves.  Avoid or limit alcohol. Too much alcohol can cause a lack of B vitamins. These vitamins are needed for healthy nerves.  Develop a good support system. Coping with peripheral neuropathy can be stressful. Talk to a mental health specialist or join a support group if you are struggling.  Follow up with your health care Darryon Bastin as directed. SEEK MEDICAL CARE IF:   You have new signs or symptoms of peripheral neuropathy.  You are struggling emotionally from dealing with peripheral neuropathy.  You have a fever. SEEK IMMEDIATE MEDICAL CARE IF:   You have an injury or infection that is not healing.  You feel very dizzy or begin vomiting.  You have chest pain.  You have trouble breathing. Document Released: 01/26/2002 Document Revised: 10/18/2010 Document Reviewed:  10/13/2012 [facility] Patient Information 2015 [place], [place]. This information is not intended to replace advice given to you by your health care Sanaia Jasso. Make sure you discuss any questions you have with your health care Azora Bonzo.
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
## 2014-11-26 ENCOUNTER — Ambulatory Visit (INDEPENDENT_AMBULATORY_CARE_PROVIDER_SITE_OTHER): Payer: PRIVATE HEALTH INSURANCE | Admitting: Family

## 2014-11-26 ENCOUNTER — Encounter: Payer: Self-pay | Admitting: Family

## 2014-11-26 VITALS — BP 133/78 | HR 85 | Temp 98.0°F | Ht 79.0 in | Wt 304.8 lb

## 2014-11-26 DIAGNOSIS — G629 Polyneuropathy, unspecified: Secondary | ICD-10-CM

## 2014-11-26 DIAGNOSIS — I739 Peripheral vascular disease, unspecified: Secondary | ICD-10-CM

## 2014-11-26 MED ORDER — GABAPENTIN 300 MG PO CAPS: 300.0000 mg | ORAL_CAPSULE | Freq: Three times a day (TID) | ORAL | Status: DC

## 2014-11-26 NOTE — Progress Notes (Signed)
   Subjective:    Patient ID: Kevin Beck, male    DOB: 1965-06-24, 49 y.o.   MRN: [id number]  HPI Pt presents to the office today with bilateral foot numbness with  the right worse than the left. Pt was started on gabapentin 100 mg TID.    Review of Systems  Constitutional: Negative.   HENT: Negative.   Respiratory: Negative.   Cardiovascular: Negative.   Gastrointestinal: Negative.   Endocrine: Negative.   Genitourinary: Negative.   Musculoskeletal: Negative.   Neurological: Negative.   Hematological: Negative.   Psychiatric/Behavioral: Negative.   All other systems reviewed and are negative.      Objective:   Physical Exam  Constitutional: He is oriented to person, place, and time. He appears well-developed and well-nourished. No distress.  HENT:  Head: Normocephalic.  Eyes: Pupils are equal, round, and reactive to light. Right eye exhibits no discharge. Left eye exhibits no discharge.  Neck: Normal range of motion. Neck supple. No thyromegaly present.  Cardiovascular: Normal rate, regular rhythm, normal heart sounds and intact distal pulses.   No murmur heard. Pulmonary/Chest: Effort normal and breath sounds normal. No respiratory distress. He has no wheezes.  Abdominal: Soft. Bowel sounds are normal. He exhibits no distension. There is no tenderness.  Musculoskeletal: Normal range of motion. He exhibits no edema or tenderness.  discoloration of bilateral feet- Purplish color, strong pulses  Neurological: He is alert and oriented to person, place, and time. He has normal reflexes. No cranial nerve deficit.  Skin: Skin is warm and dry. No rash noted. No erythema.  Psychiatric: He has a normal mood and affect. His behavior is normal. Judgment and thought content normal.  Vitals reviewed.   BP 133/78 mmHg  Pulse 85  Temp(Src) 98 F (36.7 C) (Oral)  Ht 6\' 7"  (2.007 m)  Wt 304 lb 12.8 oz (138.256 kg)  BMI 34.32 kg/m2       Assessment & Plan:  1. Peripheral  polyneuropathy ([place]) - Ambulatory referral to Vascular Surgery - gabapentin (NEURONTIN) 300 MG capsule; Take 1 capsule (300 mg total) by mouth 3 (three) times daily.  Dispense: 90 capsule; Refill: 3  2. 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

## 2014-11-26 NOTE — Patient Instructions (Addendum)
Peripheral Vascular Disease Peripheral vascular disease (PVD) is a disease of the blood vessels that are not part of your heart and brain. A simple term for PVD is poor circulation. In most cases, PVD narrows the blood vessels that carry blood from your heart to the rest of your body. This can result in a decreased supply of blood to your arms, legs, and internal organs, like your stomach or kidneys. However, it most often affects a person's lower legs and feet. There are two types of PVD.  Organic PVD. This is the more common type. It is caused by damage to the structure of blood vessels.  Functional PVD. This is caused by conditions that make blood vessels contract and tighten (spasm). Without treatment, PVD tends to get worse over time. PVD can also lead to acute ischemic limb. This is when an arm or limb suddenly has trouble getting enough blood. This is a medical emergency. CAUSES Each type of PVD has many different causes. The most common cause of PVD is buildup of a fatty material (plaque) inside of your arteries (atherosclerosis). Small amounts of plaque can break off from the walls of the blood vessels and become lodged in a smaller artery. This blocks blood flow and can cause acute ischemic limb. Other common causes of PVD include:  Blood clots that form inside of blood vessels.  Injuries to blood vessels.  Diseases that cause inflammation of blood vessels or cause blood vessel spasms.  Health behaviors and health history that increase your risk of developing PVD. RISK FACTORS  You may have a greater risk of PVD if you:  Have a family history of PVD.  Have certain medical conditions, including:  High cholesterol.  Diabetes.  High blood pressure (hypertension).  Coronary heart disease.  Past problems with blood clots.  Past injury, such as burns or a broken bone. These may have damaged blood vessels in your limbs.  Buerger disease. This is caused by inflamed blood  vessels in your hands and feet.  Some forms of arthritis.  Rare birth defects that affect the arteries in your legs.  Use tobacco.  Do not get enough exercise.  Are obese.  Are age 50 or older. SIGNS AND SYMPTOMS  PVD may cause many different symptoms. Your symptoms depend on what part of your body is not getting enough blood. Some common signs and symptoms include:  Cramps in your lower legs. This may be a symptom of poor leg circulation (claudication).  Pain and weakness in your legs while you are physically active that goes away when you rest (intermittent claudication).  Leg pain when at rest.  Leg numbness, tingling, or weakness.  Coldness in a leg or foot, especially when compared with the other leg.  Skin or hair changes. These can include:  Hair loss.  Shiny skin.  Pale or bluish skin.  Thick toenails.  Inability to get or maintain an erection (erectile dysfunction). People with PVD are more prone to developing ulcers and sores on their toes, feet, or legs. These may take longer than normal to heal. DIAGNOSIS Your health care provider may diagnose PVD from your signs and symptoms. The health care provider will also do a physical exam. You may have tests to find out what is causing your PVD and determine its severity. Tests may include:  Blood pressure recordings from your arms and legs and measurements of the strength of your pulses (pulse volume recordings).  Imaging studies using sound waves to take pictures of   the blood flow through your blood vessels (Doppler ultrasound).  Injecting a dye into your blood vessels before having imaging studies using:  X-rays (angiogram or arteriogram).  Computer-generated X-rays (CT angiogram).  A powerful electromagnetic field and a computer (magnetic resonance angiogram or MRA). TREATMENT Treatment for PVD depends on the cause of your condition and the severity of your symptoms. It also depends on your age. Underlying  causes need to be treated and controlled. These include long-lasting (chronic) conditions, such as diabetes, high cholesterol, and high blood pressure. You may need to first try making lifestyle changes and taking medicines. Surgery may be needed if these do not work. Lifestyle changes may include:  Quitting smoking.  Exercising regularly.  Following a low-fat, low-cholesterol diet. Medicines may include:  Blood thinners to prevent blood clots.  Medicines to improve blood flow.  Medicines to improve your blood cholesterol levels. Surgical procedures may include:  A procedure that uses an inflated balloon to open a blocked artery and improve blood flow (angioplasty).  A procedure to put in a tube (stent) to keep a blocked artery open (stent implant).  Surgery to reroute blood flow around a blocked artery (peripheral bypass surgery).  Surgery to remove dead tissue from an infected wound on the affected limb.  Amputation. This is surgical removal of the affected limb. This may be necessary in cases of acute ischemic limb that are not improved through medical or surgical treatments. HOME CARE INSTRUCTIONS  Take medicines only as directed by your health care provider.  Do not use any tobacco products, including cigarettes, chewing tobacco, or electronic cigarettes. If you need help quitting, ask your health care provider.  Lose weight if you are overweight, and maintain a healthy weight as directed by your health care provider.  Eat a diet that is low in fat and cholesterol. If you need help, ask your health care provider.  Exercise regularly. Ask your health care provider to suggest some good activities for you.  Use compression stockings or other mechanical devices as directed by your health care provider.  Take good care of your feet.  Wear comfortable shoes that fit well.  Check your feet often for any cuts or sores. SEEK MEDICAL CARE IF:  You have cramps in your legs  while walking.  You have leg pain when you are at rest.  You have coldness in a leg or foot.  Your skin changes.  You have erectile dysfunction.  You have cuts or sores on your feet that are not healing. SEEK IMMEDIATE MEDICAL CARE IF:  Your arm or leg turns cold and blue.  Your arms or legs become red, warm, swollen, painful, or numb.  You have chest pain or trouble breathing.  You suddenly have weakness in your face, arm, or leg.  You become very confused or lose the ability to speak.  You suddenly have a very bad headache or lose your vision.   This information is not intended to replace advice given to you by your health care provider. Make sure you discuss any questions you have with your health care provider.   Document Released: 03/15/2004 Document Revised: 02/26/2014 Document Reviewed: 07/16/2013 Elsevier Interactive Patient Education 2016 [employer] Peripheral Neuropathy Peripheral neuropathy is a type of nerve damage. It affects nerves that carry signals between the spinal cord and other parts of the body. These are called peripheral nerves. With peripheral neuropathy, one nerve or a group of nerves may be damaged.  CAUSES  Many things can  damage peripheral nerves. For some people with peripheral neuropathy, the cause is unknown. Some causes include:  Diabetes. This is the most common cause of peripheral neuropathy.  Injury to a nerve.  Pressure or stress on a nerve that lasts a long time.  Too little vitamin B. Alcoholism can lead to this.  Infections.  Autoimmune diseases, such as multiple sclerosis and systemic lupus erythematosus.  Inherited nerve diseases.  Some medicines, such as cancer drugs.  Toxic substances, such as lead and mercury.  Too little blood flowing to the legs.  Kidney disease.  Thyroid disease. SIGNS AND SYMPTOMS  Different people have different symptoms. The symptoms you have will depend on which of your nerves is damaged.  Common symptoms include:  Loss of feeling (numbness) in the feet and hands.  Tingling in the feet and hands.  Pain that burns.  Very sensitive skin.  Weakness.  Not being able to move a part of the body (paralysis).  Muscle twitching.  Clumsiness or poor coordination.  Loss of balance.  Not being able to control your bladder.  Feeling dizzy.  Sexual problems. DIAGNOSIS  Peripheral neuropathy is a symptom, not a disease. Finding the cause of peripheral neuropathy can be hard. To figure that out, your health care provider will take a medical history and do a physical exam. A neurological exam will also be done. This involves checking things affected by your brain, spinal cord, and nerves (nervous system). For example, your health care provider will check your reflexes, how you move, and what you can feel.  Other types of tests may also be ordered, such as:  Blood tests.  A test of the fluid in your spinal cord.  Imaging tests, such as CT scans or an MRI.  Electromyography (EMG). This test checks the nerves that control muscles.  Nerve conduction velocity tests. These tests check how fast messages pass through your nerves.  Nerve biopsy. A small piece of nerve is removed. It is then checked under a microscope. TREATMENT   Medicine is often used to treat peripheral neuropathy. Medicines may include:  Pain-relieving medicines. Prescription or over-the-counter medicine may be suggested.  Antiseizure medicine. This may be used for pain.  Antidepressants. These also may help ease pain from neuropathy.  Lidocaine. This is a numbing medicine. You might wear a patch or be given a shot.  Mexiletine. This medicine is typically used to help control irregular heart rhythms.  Surgery. Surgery may be needed to relieve pressure on a nerve or to destroy a nerve that is causing pain.  Physical therapy to help movement.  Assistive devices to help movement. HOME CARE  INSTRUCTIONS   Only take over-the-counter or prescription medicines as directed by your health care provider. Follow the instructions carefully for any given medicines. Do not take any other medicines without first getting approval from your health care provider.  If you have diabetes, work closely with your health care provider to keep your blood sugar under control.  If you have numbness in your feet:  Check every day for signs of injury or infection. Watch for redness, warmth, and swelling.  Wear padded socks and comfortable shoes. These help protect your feet.  Do not do things that put pressure on your damaged nerve.  Do not smoke. Smoking keeps blood from getting to damaged nerves.  Avoid or limit alcohol. Too much alcohol can cause a lack of B vitamins. These vitamins are needed for healthy nerves.  Develop a good support system. Coping  with peripheral neuropathy can be stressful. Talk to a mental health specialist or join a support group if you are struggling.  Follow up with your health care provider as directed. SEEK MEDICAL CARE IF:   You have new signs or symptoms of peripheral neuropathy.  You are struggling emotionally from dealing with peripheral neuropathy.  You have a fever. SEEK IMMEDIATE MEDICAL CARE IF:   You have an injury or infection that is not healing.  You feel very dizzy or begin vomiting.  You have chest pain.  You have trouble breathing.   This information is not intended to replace advice given to you by your health care provider. Make sure you discuss any questions you have with your health care provider.   Document Released: 01/26/2002 Document Revised: 10/18/2010 Document Reviewed: 10/13/2012 Elsevier Interactive Patient Education [employer].

## 2014-12-28 ENCOUNTER — Ambulatory Visit (INDEPENDENT_AMBULATORY_CARE_PROVIDER_SITE_OTHER): Payer: PRIVATE HEALTH INSURANCE | Admitting: Family Medicine

## 2014-12-28 ENCOUNTER — Encounter (INDEPENDENT_AMBULATORY_CARE_PROVIDER_SITE_OTHER): Payer: Self-pay

## 2014-12-28 VITALS — BP 133/90 | HR 110 | Temp 97.7°F | Ht 79.0 in | Wt 293.0 lb

## 2014-12-28 DIAGNOSIS — J209 Acute bronchitis, unspecified: Secondary | ICD-10-CM

## 2014-12-28 DIAGNOSIS — G629 Polyneuropathy, unspecified: Secondary | ICD-10-CM

## 2014-12-28 DIAGNOSIS — G549 Nerve root and plexus disorder, unspecified: Secondary | ICD-10-CM

## 2014-12-28 DIAGNOSIS — M5442 Lumbago with sciatica, left side: Secondary | ICD-10-CM | POA: Diagnosis not present

## 2014-12-28 MED ORDER — BETAMETHASONE SOD PHOS & ACET 6 (3-3) MG/ML IJ SUSP
6.0000 mg | Freq: Once | INTRAMUSCULAR | Status: AC
  Administered 2014-12-28: 6 mg via INTRAMUSCULAR

## 2014-12-28 MED ORDER — GABAPENTIN 300 MG PO CAPS: ORAL_CAPSULE | ORAL | Status: DC

## 2014-12-28 MED ORDER — BENZONATATE 200 MG PO CAPS: 200.0000 mg | ORAL_CAPSULE | Freq: Three times a day (TID) | ORAL | Status: DC | PRN

## 2014-12-28 MED ORDER — MOXIFLOXACIN HCL 400 MG PO TABS: 400.0000 mg | ORAL_TABLET | Freq: Every day | ORAL | Status: DC

## 2014-12-28 NOTE — Patient Instructions (Addendum)
I highly recommend discontinuing the Flexeril and tapering off the Valium. Instead rely on the gabapentin. He must build it slowly though the maximum rate of increase would be 300 mg every third night. You can add a 300 mg capsule every third night until you get to a total of 10 capsules. You should follow up closely with your pain physician for this however.
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
## 2014-12-30 ENCOUNTER — Telehealth: Payer: Self-pay | Admitting: Family Medicine
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
## 2014-12-30 NOTE — Telephone Encounter (Signed)
Patient is at [facility] with sob, and chest pain. They are checking him for heart attack and blood clot. Patients wife states that his symptoms have progressively gotten worse. Patients wife was told to let us know if she needed anything and to keep [place] updated.

## 2014-12-31 DIAGNOSIS — I2699 Other pulmonary embolism without acute cor pulmonale: Secondary | ICD-10-CM | POA: Insufficient documentation

## 2014-12-31 DIAGNOSIS — R9431 Abnormal electrocardiogram [ECG] [EKG]: Secondary | ICD-10-CM | POA: Insufficient documentation

## 2014-12-31 DIAGNOSIS — I82409 Acute embolism and thrombosis of unspecified deep veins of unspecified lower extremity: Secondary | ICD-10-CM | POA: Insufficient documentation

## 2014-12-31 DIAGNOSIS — E876 Hypokalemia: Secondary | ICD-10-CM | POA: Insufficient documentation

## 2014-12-31 HISTORY — PX: PATENT FORAMEN OVALE CLOSURE: SHX2181

## 2015-01-12 ENCOUNTER — Encounter: Payer: Self-pay | Admitting: Family Medicine

## 2015-01-12 ENCOUNTER — Ambulatory Visit (INDEPENDENT_AMBULATORY_CARE_PROVIDER_SITE_OTHER): Payer: PRIVATE HEALTH INSURANCE

## 2015-01-12 ENCOUNTER — Ambulatory Visit (INDEPENDENT_AMBULATORY_CARE_PROVIDER_SITE_OTHER): Payer: PRIVATE HEALTH INSURANCE | Admitting: Family Medicine

## 2015-01-12 VITALS — BP 106/73 | HR 106 | Temp 96.9°F | Ht 79.0 in | Wt 284.4 lb

## 2015-01-12 DIAGNOSIS — I513 Intracardiac thrombosis, not elsewhere classified: Secondary | ICD-10-CM | POA: Diagnosis not present

## 2015-01-12 DIAGNOSIS — I2699 Other pulmonary embolism without acute cor pulmonale: Secondary | ICD-10-CM

## 2015-01-12 DIAGNOSIS — I824Y2 Acute embolism and thrombosis of unspecified deep veins of left proximal lower extremity: Secondary | ICD-10-CM

## 2015-01-12 DIAGNOSIS — R0602 Shortness of breath: Secondary | ICD-10-CM | POA: Diagnosis not present
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
## 2015-01-13 LAB — CBC WITH DIFFERENTIAL/PLATELET
BASOS ABS: 0.1 10*3/uL (ref 0.0–0.2)
Basos: 2 %
EOS (ABSOLUTE): 0.1 10*3/uL (ref 0.0–0.4)
EOS: 2 %
HEMATOCRIT: 37.4 % — AB (ref 37.5–51.0)
HEMOGLOBIN: 12.6 g/dL (ref 12.6–17.7)
IMMATURE GRANULOCYTES: 0 %
Immature Grans (Abs): 0 10*3/uL (ref 0.0–0.1)
Lymphocytes Absolute: 1.6 10*3/uL (ref 0.7–3.1)
Lymphs: 25 %
MCH: 29.6 pg (ref 26.6–33.0)
MCHC: 33.7 g/dL (ref 31.5–35.7)
MCV: 88 fL (ref 79–97)
MONOCYTES: 8 %
Monocytes Absolute: 0.5 10*3/uL (ref 0.1–0.9)
NEUTROS PCT: 63 %
Neutrophils Absolute: 4 10*3/uL (ref 1.4–7.0)
Platelets: 434 10*3/uL — ABNORMAL HIGH (ref 150–379)
RBC: 4.25 x10E6/uL (ref 4.14–5.80)
RDW: 13.9 % (ref 12.3–15.4)
WBC: 6.5 10*3/uL (ref 3.4–10.8)

## 2015-01-13 LAB — CMP14+EGFR
A/G RATIO: 1.2 (ref 1.1–2.5)
ALT: 32 IU/L (ref 0–44)
AST: 11 IU/L (ref 0–40)
Albumin: 3.8 g/dL (ref 3.5–5.5)
Alkaline Phosphatase: 131 IU/L — ABNORMAL HIGH (ref 39–117)
BUN/Creatinine Ratio: 13 (ref 9–20)
BUN: 14 mg/dL (ref 6–24)
Bilirubin Total: 0.3 mg/dL (ref 0.0–1.2)
CALCIUM: 9.3 mg/dL (ref 8.7–10.2)
CO2: 24 mmol/L (ref 18–29)
Chloride: 100 mmol/L (ref 97–106)
Creatinine, Ser: 1.09 mg/dL (ref 0.76–1.27)
GFR calc Af Amer: 93 mL/min/{1.73_m2} (ref >59)
GFR, EST NON AFRICAN AMERICAN: 80 mL/min/{1.73_m2} (ref >59)
Globulin, Total: 3.3 g/dL (ref 1.5–4.5)
Glucose: 109 mg/dL — ABNORMAL HIGH (ref 65–99)
POTASSIUM: 4.4 mmol/L (ref 3.5–5.2)
Sodium: 140 mmol/L (ref 136–144)
Total Protein: 7.1 g/dL (ref 6.0–8.5)

## 2015-01-13 LAB — D-DIMER, QUANTITATIVE (NOT AT ARMC): D-DIMER: 13.71 mg{FEU}/L — AB (ref 0.00–0.49)

## 2015-01-16 DIAGNOSIS — Z720 Tobacco use: Secondary | ICD-10-CM | POA: Insufficient documentation

## 2015-01-16 DIAGNOSIS — I513 Intracardiac thrombosis, not elsewhere classified: Secondary | ICD-10-CM | POA: Insufficient documentation

## 2015-01-24 ENCOUNTER — Ambulatory Visit (INDEPENDENT_AMBULATORY_CARE_PROVIDER_SITE_OTHER): Payer: PRIVATE HEALTH INSURANCE | Admitting: Family Medicine

## 2015-01-24 ENCOUNTER — Encounter: Payer: Self-pay | Admitting: Family Medicine

## 2015-01-24 VITALS — BP 115/74 | HR 98 | Temp 96.9°F | Ht 79.0 in | Wt 294.0 lb

## 2015-01-24 DIAGNOSIS — I2699 Other pulmonary embolism without acute cor pulmonale: Secondary | ICD-10-CM

## 2015-01-24 DIAGNOSIS — I513 Intracardiac thrombosis, not elsewhere classified: Secondary | ICD-10-CM

## 2015-01-24 DIAGNOSIS — I824Y2 Acute embolism and thrombosis of unspecified deep veins of left proximal lower extremity: Secondary | ICD-10-CM

## 2015-01-24 LAB — POCT INR: INR: 2.3

## 2015-01-24 NOTE — Progress Notes (Signed)
Patient presents for suture removal. The wound is well healed without signs of infection.  The sutures are removed. Wound care and activity instructions given. Return prn.
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
## 2015-02-01 ENCOUNTER — Ambulatory Visit (INDEPENDENT_AMBULATORY_CARE_PROVIDER_SITE_OTHER): Payer: PRIVATE HEALTH INSURANCE | Admitting: Family Medicine

## 2015-02-01 VITALS — BP 129/87 | HR 106 | Temp 97.7°F | Ht 79.0 in | Wt 300.0 lb

## 2015-02-01 DIAGNOSIS — I513 Intracardiac thrombosis, not elsewhere classified: Secondary | ICD-10-CM | POA: Diagnosis not present

## 2015-02-01 DIAGNOSIS — E876 Hypokalemia: Secondary | ICD-10-CM | POA: Diagnosis not present

## 2015-02-01 DIAGNOSIS — G549 Nerve root and plexus disorder, unspecified: Secondary | ICD-10-CM

## 2015-02-01 DIAGNOSIS — I2692 Saddle embolus of pulmonary artery without acute cor pulmonale: Secondary | ICD-10-CM | POA: Diagnosis not present

## 2015-02-01 DIAGNOSIS — I824Y2 Acute embolism and thrombosis of unspecified deep veins of left proximal lower extremity: Secondary | ICD-10-CM | POA: Diagnosis not present

## 2015-02-01 LAB — POCT INR: INR: 2.8

## 2015-02-01 MED ORDER — GABAPENTIN 300 MG PO CAPS: ORAL_CAPSULE | ORAL | Status: DC
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
## 2015-02-05 LAB — BMP8+EGFR
BUN / CREAT RATIO: 9 (ref 9–20)
BUN: 10 mg/dL (ref 6–24)
CHLORIDE: 102 mmol/L (ref 96–106)
CO2: 24 mmol/L (ref 18–29)
CREATININE: 1.09 mg/dL (ref 0.76–1.27)
Calcium: 9.1 mg/dL (ref 8.7–10.2)
GFR calc Af Amer: 93 mL/min/{1.73_m2} (ref >59)
GFR calc non Af Amer: 80 mL/min/{1.73_m2} (ref >59)
GLUCOSE: 110 mg/dL — AB (ref 65–99)
Potassium: 4.4 mmol/L (ref 3.5–5.2)
SODIUM: 141 mmol/L (ref 134–144)

## 2015-02-17 ENCOUNTER — Encounter: Payer: Self-pay | Admitting: Family Medicine

## 2015-02-17 ENCOUNTER — Ambulatory Visit (INDEPENDENT_AMBULATORY_CARE_PROVIDER_SITE_OTHER): Payer: PRIVATE HEALTH INSURANCE | Admitting: Family Medicine

## 2015-02-17 VITALS — BP 127/77 | HR 106 | Temp 97.3°F | Ht 79.0 in | Wt 304.8 lb

## 2015-02-17 DIAGNOSIS — I82409 Acute embolism and thrombosis of unspecified deep veins of unspecified lower extremity: Secondary | ICD-10-CM

## 2015-02-17 DIAGNOSIS — J069 Acute upper respiratory infection, unspecified: Secondary | ICD-10-CM

## 2015-02-17 DIAGNOSIS — I513 Intracardiac thrombosis, not elsewhere classified: Secondary | ICD-10-CM | POA: Diagnosis not present

## 2015-02-17 DIAGNOSIS — I2692 Saddle embolus of pulmonary artery without acute cor pulmonale: Secondary | ICD-10-CM | POA: Diagnosis not present
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
## 2015-02-17 NOTE — Patient Instructions (Signed)
Continue meds as is. Weakness and soreness should continue to slowly improve.

## 2015-02-21 ENCOUNTER — Telehealth: Payer: Self-pay | Admitting: Pediatrics

## 2015-02-21 NOTE — Telephone Encounter (Signed)
Patient's wife called the after hours weekend line. Patient is having more cough. He has a lot of nasal congestion. I can hear the patient  Speaking in the background. He says he was starting to get sick more two days ago. His throat has been sore. Appetite is down. Drinking plenty of fluids. Breathing is fine. Wife checked his pulse ox and it was 95%. He's been taking his Coumadin as prescribed. Recent diagnosis of saddle pulmonary embolus.no fevers chills, muscle aches. No chest pain. Does not feel short of breath. Discussed trying sinus rinses. If he is still not feeling well tomorrow he should come in and be seen. If he's breathing gets worse she can call me back. If he is getting short of breath he needs to be seen today either in urgent care or ED. Wife voiced understanding.

## 2015-02-22 ENCOUNTER — Encounter: Payer: Self-pay | Admitting: Family Medicine

## 2015-02-22 ENCOUNTER — Ambulatory Visit (INDEPENDENT_AMBULATORY_CARE_PROVIDER_SITE_OTHER): Payer: PRIVATE HEALTH INSURANCE | Admitting: Family Medicine

## 2015-02-22 ENCOUNTER — Ambulatory Visit: Payer: PRIVATE HEALTH INSURANCE

## 2015-02-22 VITALS — BP 123/82 | HR 97 | Temp 98.1°F | Ht 79.0 in | Wt 299.0 lb

## 2015-02-22 DIAGNOSIS — J201 Acute bronchitis due to Hemophilus influenzae: Secondary | ICD-10-CM

## 2015-02-22 DIAGNOSIS — I82409 Acute embolism and thrombosis of unspecified deep veins of unspecified lower extremity: Secondary | ICD-10-CM

## 2015-02-22 DIAGNOSIS — I513 Intracardiac thrombosis, not elsewhere classified: Secondary | ICD-10-CM | POA: Diagnosis not present

## 2015-02-22 LAB — POCT INR: INR: 2.7

## 2015-02-22 MED ORDER — AZITHROMYCIN 250 MG PO TABS: ORAL_TABLET | ORAL | Status: DC

## 2015-02-22 MED ORDER — HYDROCODONE-HOMATROPINE 5-1.5 MG/5ML PO SYRP: 5.0000 mL | ORAL_SOLUTION | Freq: Four times a day (QID) | ORAL | Status: DC | PRN
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
## 2015-03-22 ENCOUNTER — Ambulatory Visit (INDEPENDENT_AMBULATORY_CARE_PROVIDER_SITE_OTHER): Payer: PRIVATE HEALTH INSURANCE | Admitting: Family Medicine

## 2015-03-22 ENCOUNTER — Encounter: Payer: Self-pay | Admitting: Family Medicine

## 2015-03-22 VITALS — BP 122/76 | HR 87 | Temp 97.3°F | Ht 79.0 in | Wt 302.0 lb

## 2015-03-22 DIAGNOSIS — I82409 Acute embolism and thrombosis of unspecified deep veins of unspecified lower extremity: Secondary | ICD-10-CM | POA: Diagnosis not present

## 2015-03-22 DIAGNOSIS — G549 Nerve root and plexus disorder, unspecified: Secondary | ICD-10-CM

## 2015-03-22 DIAGNOSIS — I513 Intracardiac thrombosis, not elsewhere classified: Secondary | ICD-10-CM

## 2015-03-22 DIAGNOSIS — R0602 Shortness of breath: Secondary | ICD-10-CM | POA: Diagnosis not present

## 2015-03-22 MED ORDER — BENZONATATE 200 MG PO CAPS: 200.0000 mg | ORAL_CAPSULE | Freq: Three times a day (TID) | ORAL | Status: DC | PRN

## 2015-03-22 MED ORDER — GABAPENTIN 600 MG PO TABS: 600.0000 mg | ORAL_TABLET | Freq: Three times a day (TID) | ORAL | Status: DC
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
## 2015-03-23 ENCOUNTER — Other Ambulatory Visit: Payer: Self-pay | Admitting: Family Medicine

## 2015-04-19 ENCOUNTER — Encounter: Payer: Self-pay | Admitting: *Deleted

## 2015-04-26 ENCOUNTER — Other Ambulatory Visit: Payer: Self-pay | Admitting: Family Medicine

## 2015-04-27 ENCOUNTER — Telehealth: Payer: Self-pay | Admitting: Family Medicine

## 2015-04-27 DIAGNOSIS — I82409 Acute embolism and thrombosis of unspecified deep veins of unspecified lower extremity: Secondary | ICD-10-CM

## 2015-04-27 DIAGNOSIS — I2692 Saddle embolus of pulmonary artery without acute cor pulmonale: Secondary | ICD-10-CM

## 2015-04-28 DIAGNOSIS — I82409 Acute embolism and thrombosis of unspecified deep veins of unspecified lower extremity: Secondary | ICD-10-CM | POA: Insufficient documentation

## 2015-04-28 DIAGNOSIS — I2692 Saddle embolus of pulmonary artery without acute cor pulmonale: Secondary | ICD-10-CM | POA: Insufficient documentation

## 2015-04-28 NOTE — Telephone Encounter (Signed)
I spoke with patient and set appt for end of March since INR has been done sometime at the end of February and was 2.1 although there is no record of this.  Explained to patient that it is our office policy to have an appt for protime.  He is agreeable to this.  I will discuss at appt the possibility of getting [place] home INR monitor if insurance will allow.

## 2015-04-28 NOTE — Telephone Encounter (Signed)
Before i could call in 2 weeks of warfarin, someone else had already filled it. I did leave him a message, see his response.

## 2015-05-18 ENCOUNTER — Other Ambulatory Visit: Payer: Self-pay | Admitting: Pharmacist

## 2015-05-18 ENCOUNTER — Ambulatory Visit (INDEPENDENT_AMBULATORY_CARE_PROVIDER_SITE_OTHER): Payer: PRIVATE HEALTH INSURANCE | Admitting: Pharmacist

## 2015-05-18 DIAGNOSIS — I2692 Saddle embolus of pulmonary artery without acute cor pulmonale: Secondary | ICD-10-CM | POA: Diagnosis not present

## 2015-05-18 DIAGNOSIS — I82409 Acute embolism and thrombosis of unspecified deep veins of unspecified lower extremity: Secondary | ICD-10-CM

## 2015-05-18 LAB — COAGUCHEK XS/INR WAIVED
INR: 1.1 (ref 0.9–1.1)
Prothrombin Time: 13.7 s

## 2015-05-18 NOTE — Patient Instructions (Signed)
Anticoagulation Dose Instructions as of 05/18/2015      Kevin Beck Tue Wed Thu Fri Sat   New Dose 7.5 mg 7.5 mg 7.5 mg 15 mg 15 mg 11.25 mg 7.5 mg   Alt Week 7.5 mg 11.25 mg 7.5 mg 11.25 mg 7.5 mg 11.25 mg 7.5 mg    Description        Take 2 tablets for 2 days, then increase dose to 1 and 1/2 tablet on mondays, wednesdays and fridays - take 1 tablet all other days.     INR was 1.1 today

## 2015-05-24 ENCOUNTER — Encounter (INDEPENDENT_AMBULATORY_CARE_PROVIDER_SITE_OTHER): Payer: Self-pay

## 2015-05-24 ENCOUNTER — Ambulatory Visit (INDEPENDENT_AMBULATORY_CARE_PROVIDER_SITE_OTHER): Payer: PRIVATE HEALTH INSURANCE | Admitting: Family Medicine

## 2015-05-24 ENCOUNTER — Encounter: Payer: Self-pay | Admitting: Family Medicine

## 2015-05-24 ENCOUNTER — Ambulatory Visit (INDEPENDENT_AMBULATORY_CARE_PROVIDER_SITE_OTHER): Payer: PRIVATE HEALTH INSURANCE

## 2015-05-24 ENCOUNTER — Encounter: Payer: Self-pay | Admitting: *Deleted

## 2015-05-24 VITALS — BP 117/76 | HR 84 | Temp 97.3°F | Ht >= 80 in | Wt 296.4 lb

## 2015-05-24 DIAGNOSIS — I2692 Saddle embolus of pulmonary artery without acute cor pulmonale: Secondary | ICD-10-CM | POA: Diagnosis not present

## 2015-05-24 DIAGNOSIS — I82409 Acute embolism and thrombosis of unspecified deep veins of unspecified lower extremity: Secondary | ICD-10-CM

## 2015-05-24 DIAGNOSIS — J201 Acute bronchitis due to Hemophilus influenzae: Secondary | ICD-10-CM

## 2015-05-24 LAB — COAGUCHEK XS/INR WAIVED
INR: 1.8 — AB (ref 0.9–1.1)
PROTHROMBIN TIME: 21.7 s

## 2015-05-24 MED ORDER — LEVOFLOXACIN 500 MG PO TABS: 500.0000 mg | ORAL_TABLET | Freq: Every day | ORAL | Status: DC

## 2015-05-24 MED ORDER — HYDROCODONE-HOMATROPINE 5-1.5 MG/5ML PO SYRP: 5.0000 mL | ORAL_SOLUTION | Freq: Four times a day (QID) | ORAL | Status: DC | PRN

## 2015-05-24 NOTE — Progress Notes (Signed)
Quick Note:  Your chest x-ray looked normal. Thanks, WS. ______ 
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
## 2015-05-27 ENCOUNTER — Encounter: Payer: Self-pay | Admitting: Family Medicine

## 2015-05-27 ENCOUNTER — Ambulatory Visit (INDEPENDENT_AMBULATORY_CARE_PROVIDER_SITE_OTHER): Payer: PRIVATE HEALTH INSURANCE | Admitting: Family Medicine

## 2015-05-27 VITALS — BP 123/77 | HR 80 | Temp 97.1°F | Ht >= 80 in | Wt 299.4 lb

## 2015-05-27 DIAGNOSIS — I82409 Acute embolism and thrombosis of unspecified deep veins of unspecified lower extremity: Secondary | ICD-10-CM | POA: Diagnosis not present

## 2015-05-27 DIAGNOSIS — I2692 Saddle embolus of pulmonary artery without acute cor pulmonale: Secondary | ICD-10-CM

## 2015-05-27 DIAGNOSIS — J201 Acute bronchitis due to Hemophilus influenzae: Secondary | ICD-10-CM

## 2015-05-27 LAB — COAGUCHEK XS/INR WAIVED
INR: 2.2 — ABNORMAL HIGH (ref 0.9–1.1)
Prothrombin Time: 26.6 s
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
## 2015-05-31 ENCOUNTER — Other Ambulatory Visit: Payer: Self-pay | Admitting: Family Medicine

## 2015-06-08 ENCOUNTER — Encounter: Payer: Self-pay | Admitting: Family Medicine

## 2015-06-08 ENCOUNTER — Ambulatory Visit (INDEPENDENT_AMBULATORY_CARE_PROVIDER_SITE_OTHER): Payer: PRIVATE HEALTH INSURANCE | Admitting: Family Medicine

## 2015-06-08 VITALS — BP 117/79 | HR 82 | Temp 97.0°F | Ht >= 80 in | Wt 295.0 lb

## 2015-06-08 DIAGNOSIS — J201 Acute bronchitis due to Hemophilus influenzae: Secondary | ICD-10-CM

## 2015-06-08 DIAGNOSIS — R0602 Shortness of breath: Secondary | ICD-10-CM

## 2015-06-08 DIAGNOSIS — I2692 Saddle embolus of pulmonary artery without acute cor pulmonale: Secondary | ICD-10-CM

## 2015-06-08 DIAGNOSIS — I82409 Acute embolism and thrombosis of unspecified deep veins of unspecified lower extremity: Secondary | ICD-10-CM

## 2015-06-08 DIAGNOSIS — J984 Other disorders of lung: Secondary | ICD-10-CM | POA: Insufficient documentation

## 2015-06-08 LAB — COAGUCHEK XS/INR WAIVED
INR: 2.5 — AB (ref 0.9–1.1)
Prothrombin Time: 29.7 s

## 2015-06-08 MED ORDER — MOXIFLOXACIN HCL 400 MG PO TABS: 400.0000 mg | ORAL_TABLET | Freq: Every day | ORAL | Status: DC

## 2015-06-08 MED ORDER — HYDROCODONE-HOMATROPINE 5-1.5 MG/5ML PO SYRP: 5.0000 mL | ORAL_SOLUTION | Freq: Four times a day (QID) | ORAL | Status: DC | PRN
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
## 2015-06-13 ENCOUNTER — Ambulatory Visit (INDEPENDENT_AMBULATORY_CARE_PROVIDER_SITE_OTHER): Payer: PRIVATE HEALTH INSURANCE | Admitting: Family Medicine

## 2015-06-13 ENCOUNTER — Encounter: Payer: Self-pay | Admitting: Family Medicine

## 2015-06-13 DIAGNOSIS — I2692 Saddle embolus of pulmonary artery without acute cor pulmonale: Secondary | ICD-10-CM | POA: Diagnosis not present

## 2015-06-13 DIAGNOSIS — I82409 Acute embolism and thrombosis of unspecified deep veins of unspecified lower extremity: Secondary | ICD-10-CM | POA: Diagnosis not present

## 2015-06-13 LAB — COAGUCHEK XS/INR WAIVED
INR: 3.8 — AB (ref 0.9–1.1)
Prothrombin Time: 46.1 s

## 2015-06-13 MED ORDER — WARFARIN SODIUM 1 MG PO TABS: ORAL_TABLET | ORAL | Status: DC

## 2015-06-13 MED ORDER — WARFARIN SODIUM 5 MG PO TABS: ORAL_TABLET | ORAL | Status: DC

## 2015-06-13 MED ORDER — SULFAMETHOXAZOLE-TRIMETHOPRIM 800-160 MG PO TABS: 1.0000 | ORAL_TABLET | Freq: Two times a day (BID) | ORAL | Status: DC

## 2015-06-13 MED ORDER — AZITHROMYCIN 250 MG PO TABS: ORAL_TABLET | ORAL | Status: DC
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
## 2015-06-13 NOTE — Patient Instructions (Addendum)
Take coumadin (Warfarin) one 5 mg tab and four 1 mg tabs  Take 1/2 of a 7.5 today. Start the new program tomorrow

## 2015-06-17 ENCOUNTER — Encounter: Payer: Self-pay | Admitting: Family Medicine

## 2015-06-17 ENCOUNTER — Ambulatory Visit: Payer: PRIVATE HEALTH INSURANCE | Admitting: Family Medicine

## 2015-06-17 ENCOUNTER — Ambulatory Visit (INDEPENDENT_AMBULATORY_CARE_PROVIDER_SITE_OTHER): Payer: PRIVATE HEALTH INSURANCE | Admitting: Family Medicine

## 2015-06-17 DIAGNOSIS — I2692 Saddle embolus of pulmonary artery without acute cor pulmonale: Secondary | ICD-10-CM

## 2015-06-17 DIAGNOSIS — I82409 Acute embolism and thrombosis of unspecified deep veins of unspecified lower extremity: Secondary | ICD-10-CM

## 2015-06-17 LAB — COAGUCHEK XS/INR WAIVED
INR: 2.5 — ABNORMAL HIGH (ref 0.9–1.1)
PROTHROMBIN TIME: 30.2 s

## 2015-06-17 NOTE — Progress Notes (Signed)
Subjective:     Patient ID: Kevin Beck, male   DOB: 1965-10-18, 50 y.o.   MRN: [id number]  HPI  Feeling better. Cough is still productive. Taking antibiotic as ordered. Additionally, patient is taking anticoagulants. Patient denies any recent excessive bleeding episodes including epistaxis, bleeding from the gums, genitalia, rectal bleeding or hematuria. Additionally there has been no excessive bruising. Review of Systems Unchanged    Objective:   Physical Exam Alert and oriented 3 INR = 2.5    Assessment:     Deep vein thrombosis (DVT) of lower extremity, unspecified chronicity, unspecified laterality, unspecified vein ([place]) - Plan: CoaguChek XS/INR Waived  Acute saddle pulmonary embolism without acute cor pulmonale (HCC) - Plan: CoaguChek XS/INR Waived       Plan:     Continue Coumadin as is follow-up is in one to one and a half weeks sooner if needed for change in symptoms such as bleeding or worsening of cough

## 2015-06-21 ENCOUNTER — Telehealth: Payer: Self-pay | Admitting: Family Medicine

## 2015-06-21 ENCOUNTER — Ambulatory Visit: Payer: PRIVATE HEALTH INSURANCE | Admitting: Family Medicine

## 2015-06-21 NOTE — Telephone Encounter (Signed)
Please  write and I will sign. Thanks, WS 

## 2015-06-22 ENCOUNTER — Encounter: Payer: Self-pay | Admitting: *Deleted

## 2015-06-27 ENCOUNTER — Encounter: Payer: Self-pay | Admitting: Family Medicine

## 2015-06-27 ENCOUNTER — Ambulatory Visit (INDEPENDENT_AMBULATORY_CARE_PROVIDER_SITE_OTHER): Payer: PRIVATE HEALTH INSURANCE | Admitting: Family Medicine

## 2015-06-27 VITALS — BP 113/67 | HR 90 | Temp 96.9°F | Ht >= 80 in | Wt 299.4 lb

## 2015-06-27 DIAGNOSIS — I2692 Saddle embolus of pulmonary artery without acute cor pulmonale: Secondary | ICD-10-CM | POA: Diagnosis not present

## 2015-06-27 DIAGNOSIS — Z7901 Long term (current) use of anticoagulants: Secondary | ICD-10-CM | POA: Diagnosis not present

## 2015-06-27 DIAGNOSIS — I513 Intracardiac thrombosis, not elsewhere classified: Secondary | ICD-10-CM

## 2015-06-27 DIAGNOSIS — R0602 Shortness of breath: Secondary | ICD-10-CM | POA: Diagnosis not present

## 2015-06-27 DIAGNOSIS — R002 Palpitations: Secondary | ICD-10-CM

## 2015-06-27 DIAGNOSIS — R072 Precordial pain: Secondary | ICD-10-CM | POA: Insufficient documentation

## 2015-06-27 DIAGNOSIS — I82409 Acute embolism and thrombosis of unspecified deep veins of unspecified lower extremity: Secondary | ICD-10-CM

## 2015-06-27 LAB — COAGUCHEK XS/INR WAIVED
INR: 1.7 — ABNORMAL HIGH (ref 0.9–1.1)
Prothrombin Time: 19.8 s
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
## 2015-06-28 ENCOUNTER — Ambulatory Visit (INDEPENDENT_AMBULATORY_CARE_PROVIDER_SITE_OTHER): Payer: PRIVATE HEALTH INSURANCE | Admitting: *Deleted

## 2015-06-28 DIAGNOSIS — R002 Palpitations: Secondary | ICD-10-CM

## 2015-06-28 DIAGNOSIS — R072 Precordial pain: Secondary | ICD-10-CM

## 2015-06-28 DIAGNOSIS — I2692 Saddle embolus of pulmonary artery without acute cor pulmonale: Secondary | ICD-10-CM | POA: Diagnosis not present

## 2015-06-28 NOTE — Progress Notes (Signed)
24 hour holter monitor place and patient will return monitor tomorrow around this time.

## 2015-07-08 ENCOUNTER — Ambulatory Visit (INDEPENDENT_AMBULATORY_CARE_PROVIDER_SITE_OTHER): Payer: PRIVATE HEALTH INSURANCE | Admitting: Family Medicine

## 2015-07-08 ENCOUNTER — Encounter: Payer: Self-pay | Admitting: Family Medicine

## 2015-07-08 VITALS — Ht >= 80 in

## 2015-07-08 DIAGNOSIS — I2692 Saddle embolus of pulmonary artery without acute cor pulmonale: Secondary | ICD-10-CM | POA: Diagnosis not present

## 2015-07-08 DIAGNOSIS — I82409 Acute embolism and thrombosis of unspecified deep veins of unspecified lower extremity: Secondary | ICD-10-CM | POA: Diagnosis not present

## 2015-07-08 DIAGNOSIS — R0602 Shortness of breath: Secondary | ICD-10-CM

## 2015-07-08 LAB — COAGUCHEK XS/INR WAIVED
INR: 2.6 — AB (ref 0.9–1.1)
PROTHROMBIN TIME: 31 s
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
## 2015-07-14 ENCOUNTER — Ambulatory Visit (HOSPITAL_COMMUNITY)
Admission: RE | Admit: 2015-07-14 | Discharge: 2015-07-14 | Disposition: A | Discharge status: Home / Self Care | Payer: No Typology Code available for payment source | Source: Ambulatory Visit | Attending: Family Medicine | Admitting: Family Medicine

## 2015-07-14 DIAGNOSIS — R918 Other nonspecific abnormal finding of lung field: Secondary | ICD-10-CM | POA: Insufficient documentation

## 2015-07-14 DIAGNOSIS — I2692 Saddle embolus of pulmonary artery without acute cor pulmonale: Secondary | ICD-10-CM

## 2015-07-14 DIAGNOSIS — Z86711 Personal history of pulmonary embolism: Secondary | ICD-10-CM | POA: Insufficient documentation

## 2015-07-14 DIAGNOSIS — Z09 Encounter for follow-up examination after completed treatment for conditions other than malignant neoplasm: Secondary | ICD-10-CM | POA: Insufficient documentation

## 2015-07-14 DIAGNOSIS — R0602 Shortness of breath: Secondary | ICD-10-CM | POA: Insufficient documentation

## 2015-07-14 MED ORDER — IOPAMIDOL (ISOVUE-370) INJECTION 76%
100.0000 mL | Freq: Once | INTRAVENOUS | Status: AC | PRN
  Administered 2015-07-14: 100 mL via INTRAVENOUS

## 2015-08-09 ENCOUNTER — Ambulatory Visit: Payer: PRIVATE HEALTH INSURANCE | Admitting: Family Medicine

## 2015-08-09 ENCOUNTER — Encounter: Payer: Self-pay | Admitting: Family

## 2015-08-09 ENCOUNTER — Ambulatory Visit (INDEPENDENT_AMBULATORY_CARE_PROVIDER_SITE_OTHER): Payer: PRIVATE HEALTH INSURANCE | Admitting: Family

## 2015-08-09 VITALS — BP 115/80 | HR 82 | Temp 97.0°F | Ht >= 80 in | Wt 304.8 lb

## 2015-08-09 DIAGNOSIS — Z720 Tobacco use: Secondary | ICD-10-CM | POA: Diagnosis not present

## 2015-08-09 DIAGNOSIS — I2692 Saddle embolus of pulmonary artery without acute cor pulmonale: Secondary | ICD-10-CM

## 2015-08-09 DIAGNOSIS — Z7901 Long term (current) use of anticoagulants: Secondary | ICD-10-CM

## 2015-08-09 DIAGNOSIS — I82409 Acute embolism and thrombosis of unspecified deep veins of unspecified lower extremity: Secondary | ICD-10-CM

## 2015-08-09 LAB — COAGUCHEK XS/INR WAIVED
INR: 1.3 — AB (ref 0.9–1.1)
PROTHROMBIN TIME: 16.2 s

## 2015-08-09 NOTE — Progress Notes (Signed)
   Subjective:    Patient ID: Kevin Beck, male    DOB: 1965/09/21, 50 y.o.   MRN: [id number]  HPI Pt presents to the office today for follow up on warfarin. Pt's INR is 1.3 today. PT had a PE and DVT in 12/2014. Pt denies any headache, palpitations or edema at this time. Pt states he always has SOB, but denies any new SOB. PT is followed by cardiologists annually.     Review of Systems  Constitutional: Negative.   HENT: Negative.   Respiratory: Negative.   Cardiovascular: Negative.   Gastrointestinal: Negative.   Endocrine: Negative.   Genitourinary: Negative.   Musculoskeletal: Negative.   Neurological: Negative.   Hematological: Negative.   Psychiatric/Behavioral: Negative.   All other systems reviewed and are negative.      Objective:   Physical Exam  Constitutional: He is oriented to person, place, and time. He appears well-developed and well-nourished. No distress.  HENT:  Head: Normocephalic.  Right Ear: External ear normal.  Left Ear: External ear normal.  Nose: Nose normal.  Mouth/Throat: Oropharynx is clear and moist.  Eyes: Pupils are equal, round, and reactive to light. Right eye exhibits no discharge. Left eye exhibits no discharge.  Neck: Normal range of motion. Neck supple. No thyromegaly present.  Cardiovascular: Normal rate, regular rhythm, normal heart sounds and intact distal pulses.   No murmur heard. Pulmonary/Chest: Effort normal and breath sounds normal. No respiratory distress. He has no wheezes.  Abdominal: Soft. Bowel sounds are normal. He exhibits no distension. There is no tenderness.  Musculoskeletal: Normal range of motion. He exhibits no edema or tenderness.  Neurological: He is alert and oriented to person, place, and time.  Skin: Skin is warm and dry. No rash noted. No erythema.  Psychiatric: He has a normal mood and affect. His behavior is normal. Judgment and thought content normal.  Vitals reviewed.   BP 115/80 mmHg  Pulse 82   Temp(Src) 97 F (36.1 C) (Oral)  Ht 6\' 8"  (2.032 m)  Wt 304 lb 12.8 oz (138.256 kg)  BMI 33.48 kg/m2.      Assessment & Plan:  1. Deep vein thrombosis (DVT) of lower extremity, unspecified chronicity, unspecified laterality, unspecified vein (HCC) - CoaguChek XS/INR Waived  2. Acute saddle pulmonary embolism without acute cor pulmonale (HCC) - CoaguChek XS/INR Waived  3. Long term current use of anticoagulant therapy  4. Current tobacco use  Anticoagulation Dose Instructions as of 08/09/2015      Dorene Grebe Tue Wed Thu Fri Sat   New Dose 10 mg 10 mg 12.5 mg 10 mg 12.5 mg 10 mg 10 mg    Description        PT to take 12.5mg  on Tuesday and Thursday, and 10 mg the rest of the week . PT to take 15mg  today then resume schedule       Continue all meds Health Maintenance reviewed Diet and exercise encouraged RTO 2 weeks   Evelina Dun, FNP

## 2015-08-09 NOTE — Patient Instructions (Addendum)
Warfarin Coagulopathy °Warfarin (Coumadin®) coagulopathy refers to bleeding that may occur as a complication of the medicine warfarin. Warfarin is an oral blood thinner (anticoagulant). Warfarin is used for medical conditions where thinning of the blood is needed to prevent blood clots.  °CAUSES °Bleeding is the most common and most serious complication of warfarin. The amount of bleeding is related to the warfarin dose and length of treatment. In addition, bleeding complications can also occur due to: °· Intentional or accidental warfarin overdose. °· Underlying medical conditions. °· Dietary changes. °· Medicine, herbal, supplement, or alcohol interactions. °SYMPTOMS °Severe bleeding while on warfarin may occur from any tissue or organ. Symptoms of the blood being too thin may include: °· Bleeding from the nose or gums. °· Blood in bowel movements which may appear as bright red, dark, or black tarry stools. °· Blood in the urine which may appear as pink, red, or brown urine. °· Unusual bruising or bruising easily. °· A cut that does not stop bleeding within 10 minutes. °· Vomiting blood or continuous nausea for more than 1 day. °· Coughing up blood. °· Broken blood vessels in your eye (subconjunctival hemorrhage). °· Abdominal or back pain with or without flank bruising. °· Sudden, severe headache. °· Sudden weakness or numbness of the face, arm, or leg, especially on one side of the body. °· Sudden confusion. °· Trouble speaking (aphasia) or understanding. °· Sudden trouble seeing in one or both eyes. °· Sudden trouble walking. °· Dizziness. °· Loss of balance or coordination. °· Vaginal bleeding. °· Swelling or pain at an injection site. °· Superficial fat tissue death (necrosis) which may cause skin scarring. This is more common in women and may first present as pain in the waist, thighs, or buttocks. °HOME CARE INSTRUCTIONS °· Always contact your health care provider of any concerns or signs of possible  warfarin coagulopathy as soon as possible. °· Take warfarin exactly as directed by your health care provider. It is recommended that you take your warfarin dose at the same time of the day. If you have been told to stop taking warfarin, do not resume taking warfarin until directed to do so by your health care provider. Follow your health care provider's instructions if you accidentally take an extra dose or miss a dose of warfarin. It is very important to take warfarin as directed since bleeding or blood clots could result in chronic or permanent injury, pain, or disability. °· Keep all follow-up appointments with your health care provider as directed. It is very important to keep your appointments. Not keeping appointments could result in a chronic or permanent injury, pain, or disability because warfarin is a medicine that requires close monitoring. °· While taking warfarin, you will need to have regular blood tests to measure your blood clotting time. These blood tests usually include both the prothrombin time (PT) and International Normalized Ratio (INR) tests. The PT and INR results allow your health care provider to adjust your dose of warfarin. The dose can change for many reasons. It is critically important that you have your PT and INR levels drawn exactly as directed. Your warfarin dose may stay the same or change depending on what the PT and INR results are. Be sure to follow up with your health care provider regarding your PT and INR test results and what your warfarin dosage should be. °· Many medicines can interfere with warfarin and affect the PT and INR results. You must tell your health care provider about any and   all medicines you take. This includes all vitamins and supplements. Ask your health care provider before taking these. Prescription and over-the-counter medicine consistency is critical to warfarin management. It is important that potential interactions are checked before you start a new  medicine. Be especially cautious with aspirin and anti-inflammatory medicines. Ask your health care provider before taking these. Medicines such as antibiotics and acid-reducing medicine can interact with warfarin and can cause an increased warfarin effect. Warfarin can also interfere with the effectiveness of medicines you are taking. Do not take or discontinue any prescribed or over-the-counter medicine except on the advice of your health care provider or pharmacist. °· Some vitamins, supplements, and herbal products interfere with the effectiveness of warfarin. Vitamin E may increase the anticoagulant effects of warfarin. Vitamin K can cause warfarin to be less effective. Do not take or discontinue any vitamin, supplement, or herbal product except on the advice of your health care provider or pharmacist. °· Eat what you normally eat and keep the vitamin K content of your diet consistent. Avoid major changes in your diet, or notify your health care provider before changing your diet. Suddenly getting a lot more vitamin K could cause your blood to clot too quickly. A sudden decrease in vitamin K intake could cause your blood to clot too slowly. These changes in vitamin K intake could lead to dangerous blood clots or to bleeding. To keep your vitamin K intake consistent, you must be aware of which foods contain moderate or high amounts of vitamin K. Some foods that are high in vitamin K include spinach, kale, broccoli, cabbage, greens, Brussels sprouts, asparagus, bok choy, coleslaw, and parsley. If you drink green tea, drink the same amount each day. Arrange a visit with a dietitian to answer your questions. °· If you have a loss of appetite or get the stomach flu (viral gastroenteritis), talk to your health care provider as soon as possible. A decrease in your normal vitamin K intake can make you more sensitive to your usual dose of warfarin. °· Some medical conditions may increase your risk for bleeding while you  are taking warfarin. A fever, diarrhea lasting more than a day, worsening heart failure, or worsening liver function are some medical conditions that could affect warfarin. Contact your health care provider if you have any of these medical conditions. °· Be careful not to cut yourself when using sharp objects or while shaving. °· Alcohol can change the body's ability to handle warfarin. It is best to avoid alcoholic drinks or consume only very small amounts while taking warfarin. Notify your health care provider if you change your alcohol intake. A sudden increase in alcohol use can increase your risk of bleeding. Chronic alcohol use can cause warfarin to be less effective. °· Limit physical activities or sports that could result in a fall or cause injury. °· Do not use warfarin if you are pregnant. °· Inform all your health care providers and your dentist that you take warfarin. °· Inform all health care providers if you are taking warfarin and aspirin or platelet inhibitor medicines such as clopidogrel, ticagrelor, or prasugrel. Use of these medicines in addition to warfarin can increase your risk of bleeding or death. Taking these medicines together should only be done under the direct care of your health care providers. °SEEK IMMEDIATE MEDICAL CARE IF: °· You cough up blood. °· You have dark or black stools or there is bright red blood coming from your rectum. °· You vomit blood or have   nausea for more than 1 day.  You have blood in the urine or pink-colored urine.  You have unusual bruising or have increased bruising.  You have bleeding from the nose or gums that does not stop quickly.  You have a cut that does not stop bleeding within 2-3 minutes.  You have sudden weakness or numbness of the face, arm, or leg, especially on one side of the body.  You have sudden confusion.  You have trouble speaking (aphasia) or understanding.  You have sudden trouble seeing in one or both eyes.  You have  sudden trouble walking.  You have dizziness.  You have a loss of balance or coordination.  You have a sudden, severe headache.  You have a serious fall or head injury, even if you are not bleeding.  You have swelling or pain at an injection site.  You have unexplained tenderness or pain in the abdomen, back, waist, thighs, or buttocks. Any of these symptoms may represent a serious problem that is an emergency. Do not wait to see if the symptoms will go away. Get medical help right away. Call your local emergency services (911 in [place] Do not drive yourself to the hospital.   This information is not intended to replace advice given to you by your health care provider. Make sure you discuss any questions you have with your health care provider.   Document Released: 01/14/2006 Document Revised: 06/22/2014 Document Reviewed: 07/17/2011 Elsevier Interactive Patient Education 2016 [place].  Anticoagulation Dose Instructions as of 08/09/2015      Kevin Beck Tue Wed Thu Fri Sat   New Dose 10 mg 10 mg 12.5 mg 10 mg 12.5 mg 10 mg 10 mg    Description        PT to take 12.5mg  on Tuesday and Thursday, and 10 mg the rest of the week . PT to take 15mg  today then resume schedule

## 2015-08-24 ENCOUNTER — Ambulatory Visit: Payer: PRIVATE HEALTH INSURANCE | Admitting: Family

## 2015-08-29 ENCOUNTER — Ambulatory Visit (INDEPENDENT_AMBULATORY_CARE_PROVIDER_SITE_OTHER): Payer: PRIVATE HEALTH INSURANCE | Admitting: Family Medicine

## 2015-08-29 ENCOUNTER — Encounter: Payer: Self-pay | Admitting: Family Medicine

## 2015-08-29 VITALS — BP 118/77 | HR 78 | Temp 96.3°F | Ht >= 80 in | Wt 301.6 lb

## 2015-08-29 DIAGNOSIS — G549 Nerve root and plexus disorder, unspecified: Secondary | ICD-10-CM

## 2015-08-29 DIAGNOSIS — Z7901 Long term (current) use of anticoagulants: Secondary | ICD-10-CM

## 2015-08-29 DIAGNOSIS — I2692 Saddle embolus of pulmonary artery without acute cor pulmonale: Secondary | ICD-10-CM

## 2015-08-29 DIAGNOSIS — I82409 Acute embolism and thrombosis of unspecified deep veins of unspecified lower extremity: Secondary | ICD-10-CM

## 2015-08-29 LAB — CMP14+EGFR
A/G RATIO: 1.5 (ref 1.2–2.2)
ALBUMIN: 4.1 g/dL (ref 3.5–5.5)
ALK PHOS: 118 IU/L — AB (ref 39–117)
ALT: 26 IU/L (ref 0–44)
AST: 20 IU/L (ref 0–40)
BILIRUBIN TOTAL: 0.3 mg/dL (ref 0.0–1.2)
BUN / CREAT RATIO: 12 (ref 9–20)
BUN: 12 mg/dL (ref 6–24)
CHLORIDE: 103 mmol/L (ref 96–106)
CO2: 24 mmol/L (ref 18–29)
CREATININE: 0.97 mg/dL (ref 0.76–1.27)
Calcium: 9.2 mg/dL (ref 8.7–10.2)
GFR calc Af Amer: 107 mL/min/{1.73_m2} (ref >59)
GFR calc non Af Amer: 93 mL/min/{1.73_m2} (ref >59)
GLOBULIN, TOTAL: 2.8 g/dL (ref 1.5–4.5)
Glucose: 101 mg/dL — ABNORMAL HIGH (ref 65–99)
Potassium: 4.3 mmol/L (ref 3.5–5.2)
SODIUM: 143 mmol/L (ref 134–144)
Total Protein: 6.9 g/dL (ref 6.0–8.5)

## 2015-08-29 LAB — CBC WITH DIFFERENTIAL/PLATELET
Basophils Absolute: 0.1 10*3/uL (ref 0.0–0.2)
Basos: 1 %
EOS (ABSOLUTE): 0.2 10*3/uL (ref 0.0–0.4)
EOS: 3 %
HEMATOCRIT: 47.9 % (ref 37.5–51.0)
HEMOGLOBIN: 15.9 g/dL (ref 12.6–17.7)
Immature Grans (Abs): 0 10*3/uL (ref 0.0–0.1)
Immature Granulocytes: 0 %
LYMPHS ABS: 1.8 10*3/uL (ref 0.7–3.1)
Lymphs: 34 %
MCH: 29.8 pg (ref 26.6–33.0)
MCHC: 33.2 g/dL (ref 31.5–35.7)
MCV: 90 fL (ref 79–97)
MONOCYTES: 8 %
Monocytes Absolute: 0.4 10*3/uL (ref 0.1–0.9)
NEUTROS ABS: 2.9 10*3/uL (ref 1.4–7.0)
Neutrophils: 54 %
Platelets: 169 10*3/uL (ref 150–379)
RBC: 5.33 x10E6/uL (ref 4.14–5.80)
RDW: 14.2 % (ref 12.3–15.4)
WBC: 5.3 10*3/uL (ref 3.4–10.8)

## 2015-08-29 LAB — LIPID PANEL
CHOLESTEROL TOTAL: 174 mg/dL (ref 100–199)
Chol/HDL Ratio: 4.4 ratio units (ref 0.0–5.0)
HDL: 40 mg/dL (ref >39)
LDL CALC: 107 mg/dL — AB (ref 0–99)
Triglycerides: 134 mg/dL (ref 0–149)
VLDL Cholesterol Cal: 27 mg/dL (ref 5–40)

## 2015-08-29 LAB — COAGUCHEK XS/INR WAIVED
INR: 1.2 — ABNORMAL HIGH (ref 0.9–1.1)
PROTHROMBIN TIME: 15 s

## 2015-08-29 MED ORDER — WARFARIN SODIUM 7.5 MG PO TABS: 15.0000 mg | ORAL_TABLET | Freq: Every day | ORAL | Status: DC

## 2015-08-29 MED ORDER — GABAPENTIN 600 MG PO TABS: 600.0000 mg | ORAL_TABLET | Freq: Three times a day (TID) | ORAL | Status: DC
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
## 2015-08-30 LAB — D-DIMER, QUANTITATIVE: D-DIMER: <0.2 mg/L FEU (ref 0.00–0.49)

## 2015-09-01 ENCOUNTER — Encounter: Payer: Self-pay | Admitting: Family Medicine

## 2015-09-06 ENCOUNTER — Encounter: Payer: Self-pay | Admitting: Family Medicine

## 2015-09-06 ENCOUNTER — Ambulatory Visit (INDEPENDENT_AMBULATORY_CARE_PROVIDER_SITE_OTHER): Payer: PRIVATE HEALTH INSURANCE | Admitting: Family Medicine

## 2015-09-06 DIAGNOSIS — I2692 Saddle embolus of pulmonary artery without acute cor pulmonale: Secondary | ICD-10-CM | POA: Diagnosis not present

## 2015-09-06 DIAGNOSIS — I82409 Acute embolism and thrombosis of unspecified deep veins of unspecified lower extremity: Secondary | ICD-10-CM

## 2015-09-06 HISTORY — DX: Acute embolism and thrombosis of unspecified deep veins of unspecified lower extremity: I82.409

## 2015-09-06 HISTORY — DX: Saddle embolus of pulmonary artery without acute cor pulmonale: I26.92

## 2015-09-06 LAB — COAGUCHEK XS/INR WAIVED
INR: 4.2 — ABNORMAL HIGH (ref 0.9–1.1)
Prothrombin Time: 50.5 s

## 2015-09-06 MED ORDER — WARFARIN SODIUM 6 MG PO TABS: 6.0000 mg | ORAL_TABLET | Freq: Every day | ORAL | Status: DC

## 2015-09-06 MED ORDER — WARFARIN SODIUM 7.5 MG PO TABS: 7.5000 mg | ORAL_TABLET | Freq: Every day | ORAL | Status: DC

## 2015-09-06 MED ORDER — GABAPENTIN 600 MG PO TABS: 1200.0000 mg | ORAL_TABLET | Freq: Two times a day (BID) | ORAL | Status: DC
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
## 2015-09-06 NOTE — Patient Instructions (Signed)
Take a 6 mg and a 7.5 mg coumadin daily

## 2015-09-20 ENCOUNTER — Encounter: Payer: Self-pay | Admitting: Family Medicine

## 2015-09-20 ENCOUNTER — Ambulatory Visit (INDEPENDENT_AMBULATORY_CARE_PROVIDER_SITE_OTHER): Payer: 59 | Admitting: Family Medicine

## 2015-09-20 VITALS — BP 118/79 | HR 74 | Temp 97.2°F | Ht >= 80 in | Wt 305.8 lb

## 2015-09-20 DIAGNOSIS — G549 Nerve root and plexus disorder, unspecified: Secondary | ICD-10-CM

## 2015-09-20 DIAGNOSIS — I82409 Acute embolism and thrombosis of unspecified deep veins of unspecified lower extremity: Secondary | ICD-10-CM | POA: Diagnosis not present

## 2015-09-20 DIAGNOSIS — I2692 Saddle embolus of pulmonary artery without acute cor pulmonale: Secondary | ICD-10-CM | POA: Diagnosis not present

## 2015-09-20 LAB — COAGUCHEK XS/INR WAIVED
INR: 1.6 — ABNORMAL HIGH (ref 0.9–1.1)
Prothrombin Time: 19.7 s

## 2015-09-20 MED ORDER — GABAPENTIN 600 MG PO TABS: 1500.0000 mg | ORAL_TABLET | Freq: Two times a day (BID) | ORAL | 5 refills | Status: DC

## 2015-09-20 NOTE — Patient Instructions (Signed)
Take warfarin 7.5 mg tablet 1 pill daily. Do not take the 6 mg tablet until further notice  Increase your gabapentin 600 mg tablet to 2-1/2 tablets in the morning and 2-1/2 tablets in the evening.  Make sure you're doing flexibility exercises for your back to twice daily.
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
## 2015-10-04 ENCOUNTER — Ambulatory Visit (INDEPENDENT_AMBULATORY_CARE_PROVIDER_SITE_OTHER): Payer: 59 | Admitting: Family Medicine

## 2015-10-04 ENCOUNTER — Ambulatory Visit (INDEPENDENT_AMBULATORY_CARE_PROVIDER_SITE_OTHER): Payer: 59

## 2015-10-04 ENCOUNTER — Encounter: Payer: Self-pay | Admitting: Family Medicine

## 2015-10-04 VITALS — BP 131/81 | HR 89 | Temp 97.3°F | Ht >= 80 in | Wt 306.4 lb

## 2015-10-04 DIAGNOSIS — I82409 Acute embolism and thrombosis of unspecified deep veins of unspecified lower extremity: Secondary | ICD-10-CM

## 2015-10-04 DIAGNOSIS — I2692 Saddle embolus of pulmonary artery without acute cor pulmonale: Secondary | ICD-10-CM

## 2015-10-04 DIAGNOSIS — R0602 Shortness of breath: Secondary | ICD-10-CM

## 2015-10-04 LAB — COAGUCHEK XS/INR WAIVED
INR: 2.8 — ABNORMAL HIGH (ref 0.9–1.1)
Prothrombin Time: 33.2 s

## 2015-10-04 LAB — POCT INR: INR: 2.8

## 2015-10-04 NOTE — Addendum Note (Signed)
Addended by: [name] on: 10/04/2015 02:35 PM   Modules accepted: Orders
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
## 2015-10-04 NOTE — Progress Notes (Signed)
Your chest x-ray looked normal. Thanks, WS.

## 2015-10-31 ENCOUNTER — Encounter: Payer: Self-pay | Admitting: Pulmonary Disease

## 2015-10-31 ENCOUNTER — Ambulatory Visit (INDEPENDENT_AMBULATORY_CARE_PROVIDER_SITE_OTHER): Payer: 59 | Admitting: Pulmonary Disease

## 2015-10-31 VITALS — BP 138/76 | HR 80 | Ht 79.0 in | Wt 303.6 lb

## 2015-10-31 DIAGNOSIS — R05 Cough: Secondary | ICD-10-CM

## 2015-10-31 DIAGNOSIS — R0602 Shortness of breath: Secondary | ICD-10-CM | POA: Diagnosis not present

## 2015-10-31 DIAGNOSIS — K219 Gastro-esophageal reflux disease without esophagitis: Secondary | ICD-10-CM

## 2015-10-31 DIAGNOSIS — R911 Solitary pulmonary nodule: Secondary | ICD-10-CM | POA: Insufficient documentation

## 2015-10-31 DIAGNOSIS — Z720 Tobacco use: Secondary | ICD-10-CM | POA: Insufficient documentation

## 2015-10-31 DIAGNOSIS — Z86711 Personal history of pulmonary embolism: Secondary | ICD-10-CM | POA: Diagnosis not present

## 2015-10-31 DIAGNOSIS — I2692 Saddle embolus of pulmonary artery without acute cor pulmonale: Secondary | ICD-10-CM | POA: Diagnosis not present

## 2015-10-31 DIAGNOSIS — R059 Cough, unspecified: Secondary | ICD-10-CM | POA: Insufficient documentation

## 2015-10-31 NOTE — Assessment & Plan Note (Signed)
Massive pulmonary embolism in November 2016 treated with emergent thrombectomy and PFO [facility].  He's had a CT scan of his chest as recent as June 2017 which showed no evidence of of ongoing pulmonary embolism. However, he continues to have shortness of breath. We know that a VQ scan is a better study to assess for chronic thromboembolic pulmonary hypertension and chronic pulmonary embolism. So I think the next step would be to perform a VQ scan to assess for this problem.  Plan: VQ scan of the chest

## 2015-10-31 NOTE — Assessment & Plan Note (Signed)
As above.

## 2015-10-31 NOTE — Assessment & Plan Note (Signed)
He notes some dyspnea associated with exertion and cough. Considering his lengthy smoking history and daily sputum production this picture is worrisome for COPD. We need to have lung function testing to try to assess this. There is likely a component of deconditioning contributing.  He notes some asbestos exposure in the past. I cannot see evidence of this on my personal review of the May 2017 CT scan of the chest but he may end up having a high-resolution CT scan to evaluate for interstitial lung disease if the pulmonary function testing suggests this entity.  Plan: Pulmonary function testing See below May need to have a high-resolution CT scan of the chest

## 2015-10-31 NOTE — Assessment & Plan Note (Signed)
A 4.5 mm nodule is seen in the right lower lobe on a May 2017 CT scan of the chest. He's actually had many CT scans in the last year. The most recent was in June 2017 at [facility] which showed similar findings.  Plan: Given his smoking history he needs to have a repeat CT scan performed in June 2018.

## 2015-10-31 NOTE — Patient Instructions (Signed)
Quit smoking Follow the acid reflux lifestyle modification sheet we provided you Take Pepcid over-the-counter twice a day for the next 2 weeks Try not to eat within 3 hours of bedtime We will arrange for a lung function test We will arrange for a nuclear medicine test to see if there is any evidence of residual blood clot We will see you back in 2-3 weeks to go over these results

## 2015-10-31 NOTE — Addendum Note (Signed)
Addended by: Len Blalock on: 10/31/2015 11:38 AM   Modules accepted: Orders
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
## 2015-10-31 NOTE — Assessment & Plan Note (Signed)
Ongoing. Counsel to quit today. Provided smoking cessation information.

## 2015-10-31 NOTE — Assessment & Plan Note (Signed)
His persistent cough with mucus production is definitely due to ongoing tobacco use but I think acid reflux related to his hiatal hernia is also contributing to a significant degree. He notes ongoing acid reflux symptoms.  Plan: Counseled to take a Pepcid twice a day and given acid reflux reducing lifestyle recommendations Quit smoking

## 2015-11-03 ENCOUNTER — Ambulatory Visit (HOSPITAL_COMMUNITY)
Admission: RE | Admit: 2015-11-03 | Discharge: 2015-11-03 | Disposition: A | Discharge status: Home / Self Care | Payer: 59 | Source: Ambulatory Visit | Attending: Pulmonary Disease | Admitting: Pulmonary Disease

## 2015-11-03 DIAGNOSIS — Z86711 Personal history of pulmonary embolism: Secondary | ICD-10-CM | POA: Diagnosis not present

## 2015-11-03 DIAGNOSIS — R0602 Shortness of breath: Secondary | ICD-10-CM

## 2015-11-03 DIAGNOSIS — R079 Chest pain, unspecified: Secondary | ICD-10-CM | POA: Diagnosis not present

## 2015-11-03 MED ORDER — TECHNETIUM TC 99M DIETHYLENETRIAME-PENTAACETIC ACID: 32.7000 | Freq: Once | INTRAVENOUS | Status: DC | PRN

## 2015-11-03 MED ORDER — TECHNETIUM TO 99M ALBUMIN AGGREGATED
4.3600 | Freq: Once | INTRAVENOUS | Status: AC | PRN
  Administered 2015-11-03: 4.36 via INTRAVENOUS

## 2015-11-04 ENCOUNTER — Other Ambulatory Visit: Payer: Self-pay

## 2015-11-04 ENCOUNTER — Encounter (HOSPITAL_COMMUNITY): Payer: Self-pay

## 2015-11-04 ENCOUNTER — Ambulatory Visit (INDEPENDENT_AMBULATORY_CARE_PROVIDER_SITE_OTHER): Payer: 59 | Admitting: Family Medicine

## 2015-11-04 ENCOUNTER — Emergency Department (HOSPITAL_COMMUNITY)
Admission: EM | Admit: 2015-11-04 | Discharge: 2015-11-05 | Disposition: A | Discharge status: Home / Self Care | Payer: 59 | Attending: Emergency Medicine | Admitting: Emergency Medicine

## 2015-11-04 ENCOUNTER — Emergency Department (HOSPITAL_COMMUNITY): Payer: 59

## 2015-11-04 ENCOUNTER — Ambulatory Visit: Payer: PRIVATE HEALTH INSURANCE | Admitting: Family Medicine

## 2015-11-04 VITALS — BP 126/79 | HR 81 | Temp 97.6°F | Wt 306.6 lb

## 2015-11-04 DIAGNOSIS — R079 Chest pain, unspecified: Secondary | ICD-10-CM | POA: Insufficient documentation

## 2015-11-04 DIAGNOSIS — R279 Unspecified lack of coordination: Secondary | ICD-10-CM | POA: Diagnosis not present

## 2015-11-04 DIAGNOSIS — Z743 Need for continuous supervision: Secondary | ICD-10-CM | POA: Diagnosis not present

## 2015-11-04 DIAGNOSIS — R0789 Other chest pain: Secondary | ICD-10-CM | POA: Diagnosis not present

## 2015-11-04 DIAGNOSIS — Z7901 Long term (current) use of anticoagulants: Secondary | ICD-10-CM | POA: Insufficient documentation

## 2015-11-04 DIAGNOSIS — F17210 Nicotine dependence, cigarettes, uncomplicated: Secondary | ICD-10-CM | POA: Diagnosis not present

## 2015-11-04 LAB — COMPREHENSIVE METABOLIC PANEL
ALK PHOS: 106 U/L (ref 38–126)
ALT: 30 U/L (ref 17–63)
ANION GAP: 6 (ref 5–15)
AST: 21 U/L (ref 15–41)
Albumin: 3.5 g/dL (ref 3.5–5.0)
BILIRUBIN TOTAL: 0.3 mg/dL (ref 0.3–1.2)
BUN: 11 mg/dL (ref 6–20)
CALCIUM: 8.8 mg/dL — AB (ref 8.9–10.3)
CO2: 28 mmol/L (ref 22–32)
Chloride: 107 mmol/L (ref 101–111)
Creatinine, Ser: 1.02 mg/dL (ref 0.61–1.24)
GLUCOSE: 98 mg/dL (ref 65–99)
Potassium: 4.1 mmol/L (ref 3.5–5.1)
Sodium: 141 mmol/L (ref 135–145)
TOTAL PROTEIN: 6.5 g/dL (ref 6.5–8.1)

## 2015-11-04 LAB — I-STAT CHEM 8, ED
BUN: 12 mg/dL (ref 6–20)
CALCIUM ION: 1.16 mmol/L (ref 1.15–1.40)
CREATININE: 1.1 mg/dL (ref 0.61–1.24)
Chloride: 105 mmol/L (ref 101–111)
Glucose, Bld: 93 mg/dL (ref 65–99)
HCT: 46 % (ref 39.0–52.0)
Hemoglobin: 15.6 g/dL (ref 13.0–17.0)
Potassium: 4 mmol/L (ref 3.5–5.1)
SODIUM: 142 mmol/L (ref 135–145)
TCO2: 26 mmol/L (ref 0–100)

## 2015-11-04 LAB — CBC WITH DIFFERENTIAL/PLATELET
Basophils Absolute: 0.1 10*3/uL (ref 0.0–0.1)
Basophils Relative: 1 %
Eosinophils Absolute: 0.3 10*3/uL (ref 0.0–0.7)
Eosinophils Relative: 4 %
HEMATOCRIT: 47.6 % (ref 39.0–52.0)
HEMOGLOBIN: 15.9 g/dL (ref 13.0–17.0)
LYMPHS ABS: 2.3 10*3/uL (ref 0.7–4.0)
LYMPHS PCT: 38 %
MCH: 30.8 pg (ref 26.0–34.0)
MCHC: 33.4 g/dL (ref 30.0–36.0)
MCV: 92.1 fL (ref 78.0–100.0)
MONO ABS: 0.5 10*3/uL (ref 0.1–1.0)
MONOS PCT: 8 %
NEUTROS ABS: 3 10*3/uL (ref 1.7–7.7)
NEUTROS PCT: 49 %
Platelets: 156 10*3/uL (ref 150–400)
RBC: 5.17 MIL/uL (ref 4.22–5.81)
RDW: 13.5 % (ref 11.5–15.5)
WBC: 6.2 10*3/uL (ref 4.0–10.5)

## 2015-11-04 LAB — PROTIME-INR
INR: 0.96
PROTHROMBIN TIME: 12.7 s (ref 11.4–15.2)

## 2015-11-04 LAB — I-STAT TROPONIN, ED
Troponin i, poc: 0 ng/mL (ref 0.00–0.08)
Troponin i, poc: 0 ng/mL (ref 0.00–0.08)

## 2015-11-04 MED ORDER — ASPIRIN 81 MG PO CHEW
324.0000 mg | CHEWABLE_TABLET | Freq: Once | ORAL | Status: AC
  Administered 2015-11-04: 324 mg via ORAL
  Filled 2015-11-04: qty 4

## 2015-11-04 MED ORDER — IOPAMIDOL (ISOVUE-370) INJECTION 76%
INTRAVENOUS | Status: AC
  Administered 2015-11-04: 100 mL
  Filled 2015-11-04: qty 100

## 2015-11-04 MED ORDER — WARFARIN SODIUM 7.5 MG PO TABS
7.5000 mg | ORAL_TABLET | Freq: Once | ORAL | Status: AC
  Administered 2015-11-04: 7.5 mg via ORAL
  Filled 2015-11-04: qty 1

## 2015-11-04 NOTE — ED Notes (Signed)
Patient transported to CT 

## 2015-11-04 NOTE — ED Triage Notes (Signed)
Per EMS: Pt at PCP for chest pain and HA. Pt with extensive DVT hx in lungs and heart. Pt on coumadin, states missed 4 days ~2 weeks ago. EMS gave 4mg  zofran.
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
## 2015-11-04 NOTE — ED Provider Notes (Signed)
11:14 PM Handoff from [place] PA-C at shift change.   Patient with history of pulmonary embolism, no previous cardiac history but risk factors of smoking and family history -- with increased shortness of breath for which he was evaluated by his pulmonologist with a VQ scan recently, with chest pain today with different character occurring at rest. Now resolved. Patient missed doses of Coumadin from 9/1-9/4 but has been taking since then. Given the symptoms, plan is for CT angiogram of the chest. Will need delta troponin at 11 PM. No concerning EKG findings.  CT angiogram was negative for PE. Patient previously aware of small pulmonary nodule which is stable per patient. Awaiting repeat troponin. Patient is anxious to leave.  We discussed subtherapeutic INR. Patient states that he is currently on 7.5 mg of Coumadin daily and has been taking this. He has had recent fluctuations in INR. He has been on doses as high as 15 mg daily. He is due to have this checked again on Monday. States that he took his Coumadin as scheduled this morning. Will give additional dose tonight. Patient to follow-up closely with his Coumadin clinic.  BP 113/67   Pulse 65   Temp 98.3 F (36.8 C) (Oral)   Resp 18   SpO2 97%    11:58 PM Delta trop neg. Pt given cards referral. Encouraged f/u.   Patient was counseled to return with severe chest pain, especially if the pain is crushing or pressure-like and spreads to the arms, back, neck, or jaw, or if they have sweating, nausea, or shortness of breath with the pain. They were encouraged to call 911 with these symptoms.   They were also told to return if their chest pain gets worse and does not go away with rest, they have an attack of chest pain lasting longer than usual despite rest and treatment with the medications their caregiver has prescribed, if they wake from sleep with chest pain or shortness of breath, if they feel dizzy or faint, if they have chest pain not typical  of their usual pain, or if they have any other emergent concerns regarding their health.      Carlisle Cater, PA-C 11/04/15 [place], MD 11/07/15 [phone]

## 2015-11-04 NOTE — Discharge Instructions (Signed)
Please read and follow all provided instructions.  Your diagnoses today include:  1. Chest pain, unspecified chest pain type     Tests performed today include:  An EKG of your heart  A chest x-ray  Cardiac enzymes - a blood test for heart muscle damage  Blood counts and electrolytes  Chest CT - shows nodule as discussed, no signs of blood clot  Coumadin level - was low at 0.96  Vital signs. See below for your results today.   Medications prescribed:   None  Take any prescribed medications only as directed.  Follow-up instructions: Please follow-up with the cardiologist listed as soon as possible for evaluation of your symptoms.   Return instructions:  SEEK IMMEDIATE MEDICAL ATTENTION IF:  You have severe chest pain, especially if the pain is crushing or pressure-like and spreads to the arms, back, neck, or jaw, or if you have sweating, nausea (feeling sick to your stomach), or shortness of breath. THIS IS AN EMERGENCY. Don't wait to see if the pain will go away. Get medical help at once. Call 911 or 0 (operator). DO NOT drive yourself to the hospital.   Your chest pain gets worse and does not go away with rest.   You have an attack of chest pain lasting longer than usual, despite rest and treatment with the medications your caregiver has prescribed.   You wake from sleep with chest pain or shortness of breath.  You feel dizzy or faint.  You have chest pain not typical of your usual pain for which you originally saw your caregiver.   You have any other emergent concerns regarding your health.  Additional Information: Chest pain comes from many different causes. Your caregiver has diagnosed you as having chest pain that is not specific for one problem, but does not require admission.  You are at low risk for an acute heart condition or other serious illness.   Your vital signs today were: BP 113/67    Pulse 65    Temp 98.3 F (36.8 C) (Oral)    Resp 18    SpO2 97%    If your blood pressure (BP) was elevated above 135/85 this visit, please have this repeated by your doctor within one month. --------------

## 2015-11-05 NOTE — ED Notes (Signed)
Pt departed in NAD, refused use of wheelchair.  

## 2015-11-07 ENCOUNTER — Ambulatory Visit: Payer: PRIVATE HEALTH INSURANCE | Admitting: Family Medicine

## 2015-11-08 ENCOUNTER — Encounter: Payer: Self-pay | Admitting: Family Medicine

## 2015-11-08 ENCOUNTER — Ambulatory Visit (INDEPENDENT_AMBULATORY_CARE_PROVIDER_SITE_OTHER): Payer: 59 | Admitting: Family Medicine

## 2015-11-08 VITALS — BP 122/70 | HR 75 | Temp 96.9°F | Ht 79.0 in | Wt 304.4 lb

## 2015-11-08 DIAGNOSIS — Z7901 Long term (current) use of anticoagulants: Secondary | ICD-10-CM | POA: Diagnosis not present

## 2015-11-08 DIAGNOSIS — I2692 Saddle embolus of pulmonary artery without acute cor pulmonale: Secondary | ICD-10-CM | POA: Diagnosis not present

## 2015-11-08 DIAGNOSIS — I82409 Acute embolism and thrombosis of unspecified deep veins of unspecified lower extremity: Secondary | ICD-10-CM

## 2015-11-08 DIAGNOSIS — I513 Intracardiac thrombosis, not elsewhere classified: Secondary | ICD-10-CM

## 2015-11-08 DIAGNOSIS — I213 ST elevation (STEMI) myocardial infarction of unspecified site: Secondary | ICD-10-CM

## 2015-11-08 DIAGNOSIS — R0789 Other chest pain: Secondary | ICD-10-CM

## 2015-11-08 DIAGNOSIS — R911 Solitary pulmonary nodule: Secondary | ICD-10-CM | POA: Diagnosis not present

## 2015-11-08 DIAGNOSIS — Z720 Tobacco use: Secondary | ICD-10-CM

## 2015-11-08 LAB — COAGUCHEK XS/INR WAIVED
INR: 1.8 — AB (ref 0.9–1.1)
PROTHROMBIN TIME: 21.6 s
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
## 2015-11-11 ENCOUNTER — Encounter: Payer: Self-pay | Admitting: Family Medicine
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
## 2015-11-15 NOTE — Progress Notes (Signed)
Pt aware of recs per Dr Lake Bells

## 2015-11-16 ENCOUNTER — Encounter: Payer: Self-pay | Admitting: Pulmonary Disease

## 2015-11-16 ENCOUNTER — Ambulatory Visit (HOSPITAL_COMMUNITY)
Admission: RE | Admit: 2015-11-16 | Discharge: 2015-11-16 | Disposition: A | Discharge status: Home / Self Care | Payer: 59 | Source: Ambulatory Visit | Attending: Pulmonary Disease | Admitting: Pulmonary Disease

## 2015-11-16 ENCOUNTER — Other Ambulatory Visit (INDEPENDENT_AMBULATORY_CARE_PROVIDER_SITE_OTHER): Payer: 59

## 2015-11-16 ENCOUNTER — Ambulatory Visit (INDEPENDENT_AMBULATORY_CARE_PROVIDER_SITE_OTHER): Payer: 59 | Admitting: Pulmonary Disease

## 2015-11-16 VITALS — BP 130/86 | HR 84 | Ht 78.0 in | Wt 302.0 lb

## 2015-11-16 DIAGNOSIS — R0602 Shortness of breath: Secondary | ICD-10-CM

## 2015-11-16 DIAGNOSIS — Z720 Tobacco use: Secondary | ICD-10-CM | POA: Diagnosis not present

## 2015-11-16 DIAGNOSIS — R911 Solitary pulmonary nodule: Secondary | ICD-10-CM | POA: Diagnosis not present

## 2015-11-16 DIAGNOSIS — I2692 Saddle embolus of pulmonary artery without acute cor pulmonale: Secondary | ICD-10-CM

## 2015-11-16 LAB — PULMONARY FUNCTION TEST
DL/VA % pred: 87 %
DL/VA: 4.41 ml/min/mmHg/L
DLCO UNC % PRED: 61 %
DLCO unc: 26.57 ml/min/mmHg
FEF 25-75 PRE: 2.67 L/s
FEF 25-75 Post: 4.1 L/sec
FEF2575-%Change-Post: 53 %
FEF2575-%PRED-PRE: 61 %
FEF2575-%Pred-Post: 94 %
FEV1-%Change-Post: 12 %
FEV1-%PRED-POST: 65 %
FEV1-%Pred-Pre: 58 %
FEV1-POST: 3.31 L
FEV1-PRE: 2.94 L
FEV1FVC-%Change-Post: 5 %
FEV1FVC-%Pred-Pre: 99 %
FEV6-%CHANGE-POST: 6 %
FEV6-%PRED-POST: 63 %
FEV6-%PRED-PRE: 59 %
FEV6-POST: 4.02 L
FEV6-Pre: 3.76 L
FEV6FVC-%Change-Post: 0 %
FEV6FVC-%PRED-POST: 103 %
FEV6FVC-%Pred-Pre: 102 %
FVC-%Change-Post: 6 %
FVC-%Pred-Post: 61 %
FVC-%Pred-Pre: 57 %
FVC-Post: 4.04 L
FVC-Pre: 3.78 L
POST FEV6/FVC RATIO: 100 %
PRE FEV1/FVC RATIO: 78 %
Post FEV1/FVC ratio: 82 %
Pre FEV6/FVC Ratio: 100 %
RV % pred: 95 %
RV: 2.33 L
TLC % PRED: 72 %
TLC: 6.18 L

## 2015-11-16 LAB — BRAIN NATRIURETIC PEPTIDE: PRO B NATRI PEPTIDE: 25 pg/mL (ref 0.0–100.0)

## 2015-11-16 MED ORDER — ALBUTEROL SULFATE (2.5 MG/3ML) 0.083% IN NEBU
2.5000 mg | INHALATION_SOLUTION | Freq: Once | RESPIRATORY_TRACT | Status: AC
  Administered 2015-11-16: 2.5 mg via RESPIRATORY_TRACT

## 2015-11-16 MED ORDER — IPRATROPIUM-ALBUTEROL 20-100 MCG/ACT IN AERS: 1.0000 | INHALATION_SPRAY | Freq: Four times a day (QID) | RESPIRATORY_TRACT | 5 refills | Status: DC

## 2015-11-16 NOTE — Progress Notes (Signed)
Subjective:    Patient ID: Kevin Beck, male    DOB: Jun 22, 1965, 50 y.o.   MRN: [id number]  Synopsis: Referred in 2017 for evaluation of shortness of breath after a large pulmonary embolism in 2016.  He is a life long smoker and started smoking age 60-9.  He continues to smoke at age 28 as of 10/2015.  Records from [place] reviewed. He had an emergent thrombectomy for massive pulmonary embolism in November 2016. A PFO was closed at that time (Dr. Zannie Kehr).  He was discharged on Xarelto but was re-admitted with recurrent clot in 2016 so he was started on warfarin at that time.  He had an IVC filter placed for about 7 months at this time.    Images from May 2017 CT scan of the chest reviewed showing a 4.5 mm nodule in the right lower lobe, no obvious pulmonary parenchymal abnormality  Echocardiogram from November 2016 at [facility] shows normal RV size and function, normal LV size and function.  10/2015 V/Q > negative  September 2017 pulmonary function testing FEV1 to FVC ratio 82%, FEV1 3.31 L 65% predicted, FVC 4.04 L 61% predicted, total lung capacity 6.18 L 72% predicted, residual volume 95% predicted, DLCO 26.5 761% predicted  September 2017 CT angiogram chest images personally reviewed showing possible atelectasis in the lower lobes versus dependent edema airways appear normal, pulmonary nodule 4 mm in size in the left lower lobe, no angiographic evidence of embolism   HPI Chief Complaint  Patient presents with  . Shortness of Breath    Breathing is unchanged since last OV. Reports DOE, chest tightness, wheezing and coughing. Cough produces white mucus.   Micco had some chest pain while he was cutting the grass recently and he had some neck pain and dyspnea so he went to his doctor's office and then he ended up in the ER.    He notes that he gets short of breath while climbing stairs (today he was dyspneic after climbing three flights of stairs).  He  continues to have a cough with some mucus production.    He is still smoking 1/2 pack of cigarettes daily.    Past Medical History:  Diagnosis Date  . Bronchitis   . Headache   . Pneumonia 2008      Review of Systems  Constitutional: Negative for chills, fatigue and fever.  HENT: Negative for postnasal drip, rhinorrhea and sore throat.   Respiratory: Positive for cough and shortness of breath. Negative for wheezing.   Cardiovascular: Negative for chest pain, palpitations and leg swelling.       Objective:   Physical Exam  Vitals:   11/16/15 1104 11/16/15 1105  BP:  130/86  Pulse:  84  SpO2:  97%  Weight: (!) 302 lb (137 kg)   Height: 6\' 6"  (1.981 m)   RA  Gen: well appearing HENT: OP clear, TM's clear, neck supple PULM: CTA B, normal percussion CV: RRR, no mgr, trace edema GI: BS+, soft, nontender Derm: no cyanosis or rash Psyche: normal mood and affect    Records from [facility] reviewed. He had an emergent thrombectomy for massive pulmonary embolism in November 2016. A PFO was closed at that time.  Images from May 2017 CT scan of the chest reviewed showing a 4.5 mm nodule in the right lower lobe, no obvious pulmonary parenchymal abnormality  Ech[facility]ogram from November 2016 at [facility] shows normal RV size and function,  normal LV size and function.  10/2015 V/Q > negative     Assessment & Plan:  SOB (shortness of breath) Recent episode of dyspnea may have been due to pulmonary edema based on my review of the CT scan of his chest that he had at [facility] on 11/04/2015.  His persistent shortness of breath is complicated, likely due to deconditioning and obesity. However, lung function today suggested air-trapping but was not diagnostic of COPD. It's possible he has some degree of small airways disease based on his long smoking.  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 He now tells me that after he was discharged from [facility] he was readmitted with another pulmonary embolism. Apparently the second pulmonary embolism occurred while he was on Xarelto so he was changed to warfarin. Sounds as if he had a hypercoagulability workup performed by pulmonary team at [facility].  Recent VQ scan shows no evidence of ongoing clot.  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  Plan: Continue lifelong warfarin for now I will try to reach out to his pulmonologist at [facility], though he has no idea who that was so we'll have to check his records  Tobacco abuse Counseled at length to quit smoking today.  Solitary pulmonary nodule He's had many, many CT scans in the last 12 months. Many have been performed at [facility] as well as here. At this point the 4 mm nodule in his left lower lobe does not need to be followed again until one year from now (September 2018) to sees with a noncontrast CT scan of the chest.    Current Outpatient Prescriptions:  .  gabapentin (NEURONTIN) 600 MG tablet, Take 2.5 tablets (1,500 mg total) by mouth 2 (two) times daily., Disp: 150 tablet, Rfl: 5 .  nitroGLYCERIN (NITROSTAT) 0.4 MG SL tablet, Place 0.4 mg under the tongue every 5 (five) minutes as needed for chest pain. Reported on 02/22/2015, Disp: , Rfl:  .  omeprazole (PRILOSEC) 20 MG capsule, Take 20 mg by mouth daily., Disp: , Rfl:  .  warfarin (COUMADIN) 7.5 MG tablet, Take 1 tablet (7.5 mg total) by mouth daily at 6 PM. Take one to four as needed for blood thinner (Patient taking differently: Take 7.5 mg by mouth every morning. ), Disp: 60 tablet, Rfl: 0 .  Ipratropium-Albuterol (COMBIVENT RESPIMAT) 20-100 MCG/ACT AERS respimat, Inhale 1  puff into the lungs every 6 (six) hours., Disp: 1 Inhaler, Rfl: 5

## 2015-11-16 NOTE — Assessment & Plan Note (Signed)
Counseled at length to quit smoking today. 

## 2015-11-16 NOTE — Assessment & Plan Note (Signed)
He's had many, many CT scans in the last 12 months. Many have been performed at [facility] as well as here. At this point the 4 mm nodule in his left lower lobe does not need to be followed again until one year from now (September 2018) to sees with a noncontrast CT scan of the chest.

## 2015-11-16 NOTE — Assessment & Plan Note (Signed)
Recent episode of dyspnea may have been due to pulmonary edema based on my review of the CT scan of his chest that he had at [facility] on 11/04/2015.  His persistent shortness of breath is complicated, likely due to deconditioning and obesity. However, lung function today suggested air-trapping but was not diagnostic of COPD. It's possible he has some degree of small airways disease based on his long smoking.  Plan: ProBNP today Made a repeat echo or cardiology evaluation Combivent as needed shortness of breath Stop smoking

## 2015-11-16 NOTE — Assessment & Plan Note (Signed)
Very confusing history. He now tells me that after he was discharged from [facility] he was readmitted with another pulmonary embolism. Apparently the second pulmonary embolism occurred while he was on Xarelto so he was changed to warfarin. Sounds as if he had a hypercoagulability workup performed by pulmonary team at [facility].  Recent VQ scan shows no evidence of ongoing clot.  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  Plan: Continue lifelong warfarin for now I will try to reach out to his pulmonologist at [facility], though he has no idea who that was so we'll have to check his records

## 2015-11-16 NOTE — Patient Instructions (Signed)
Take the Combivent as needed for shortness of breath Stop smoking We'll call you with the results of the bloodwork I'm going to reach out to your heart and lung team at [facility] to try to figure out if we can stop the blood thinner, at this time however I feel that you needs to continue warfarin We will see you back in 3 months or sooner if needed

## 2015-11-17 ENCOUNTER — Encounter: Payer: Self-pay | Admitting: Pulmonary Disease

## 2015-11-17 LAB — D-DIMER, QUANTITATIVE: D-Dimer, Quant: <0.19 mcg/mL FEU (ref <0.50)

## 2015-11-23 ENCOUNTER — Other Ambulatory Visit: Payer: Self-pay

## 2015-11-23 ENCOUNTER — Encounter: Payer: Self-pay | Admitting: Family Medicine

## 2015-11-23 ENCOUNTER — Ambulatory Visit (INDEPENDENT_AMBULATORY_CARE_PROVIDER_SITE_OTHER): Payer: 59 | Admitting: Family Medicine

## 2015-11-23 VITALS — BP 124/74 | HR 83 | Temp 97.0°F | Ht 78.0 in | Wt 303.0 lb

## 2015-11-23 DIAGNOSIS — G549 Nerve root and plexus disorder, unspecified: Secondary | ICD-10-CM

## 2015-11-23 DIAGNOSIS — Z7901 Long term (current) use of anticoagulants: Secondary | ICD-10-CM

## 2015-11-23 DIAGNOSIS — Z7689 Persons encountering health services in other specified circumstances: Secondary | ICD-10-CM | POA: Diagnosis not present

## 2015-11-23 DIAGNOSIS — R0602 Shortness of breath: Secondary | ICD-10-CM

## 2015-11-23 LAB — COAGUCHEK XS/INR WAIVED
INR: 1.4 — AB (ref 0.9–1.1)
Prothrombin Time: 17.1 s
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
## 2015-11-25 ENCOUNTER — Telehealth: Payer: Self-pay | Admitting: Family Medicine

## 2015-11-28 NOTE — Telephone Encounter (Signed)
Patient aware of results.

## 2015-11-30 ENCOUNTER — Telehealth: Payer: Self-pay | Admitting: Family Medicine

## 2015-11-30 NOTE — Telephone Encounter (Signed)
I tried to call patient twice.  Left VM.  According to records his past dose was 10.5mg  daiy (1 tablet of 7.5mg  and 1/2 tablet of 6mg ).  Dr Livia Snellen recommended to increase dose to 11.25mg  daily which is 1 and 1/2 tablets of 7.5mg  warfarin.  INR was 1.4 yesterday.  Also recommended recheck INR in 7 to 10 days.

## 2015-12-01 ENCOUNTER — Telehealth: Payer: Self-pay | Admitting: Family Medicine

## 2015-12-01 ENCOUNTER — Ambulatory Visit (INDEPENDENT_AMBULATORY_CARE_PROVIDER_SITE_OTHER): Payer: 59 | Admitting: Pharmacist

## 2015-12-01 NOTE — Telephone Encounter (Signed)
Verified with patient - has been taking warfarin 7.5mg  1 and 1/2 tablet about at least the last 2 weeks.  Denies and missed doses and no s/s of bleeding.  Last INR was 1.4 (11/23/2015) but he has not been given dosing instructions yet.  Recommend he increase warfarin dose to 2 tablets on Thursdays and 1 and 1/2 tablets all other days.  RTC in 1 weeks to have INR rechecked.

## 2015-12-07 ENCOUNTER — Ambulatory Visit (INDEPENDENT_AMBULATORY_CARE_PROVIDER_SITE_OTHER): Payer: 59 | Admitting: Cardiology

## 2015-12-07 ENCOUNTER — Encounter: Payer: Self-pay | Admitting: Cardiology

## 2015-12-07 VITALS — BP 118/76 | HR 85 | Ht 79.0 in | Wt 300.0 lb

## 2015-12-07 DIAGNOSIS — R0602 Shortness of breath: Secondary | ICD-10-CM

## 2015-12-07 DIAGNOSIS — R0789 Other chest pain: Secondary | ICD-10-CM | POA: Diagnosis not present

## 2015-12-07 DIAGNOSIS — M79604 Pain in right leg: Secondary | ICD-10-CM | POA: Diagnosis not present

## 2015-12-07 DIAGNOSIS — M79605 Pain in left leg: Secondary | ICD-10-CM

## 2015-12-07 DIAGNOSIS — Z86711 Personal history of pulmonary embolism: Secondary | ICD-10-CM

## 2015-12-07 NOTE — Patient Instructions (Signed)
Medication Instructions:  Your physician recommends that you continue on your current medications as directed. Please refer to the Current Medication list given to you today.   Labwork: NONE  Testing/Procedures: Your physician has requested that you have an echocardiogram. Echocardiography is a painless test that uses sound waves to create images of your heart. It provides your doctor with information about the size and shape of your heart and how well your heart's chambers and valves are working. This procedure takes approximately one hour. There are no restrictions for this procedure.   Your physician has requested that you have an ankle brachial index (ABI). During this test an ultrasound and blood pressure cuff are used to evaluate the arteries that supply the arms and legs with blood. Allow thirty minutes for this exam. There are no restrictions or special instructions.    Follow-Up: Your physician recommends that you schedule a follow-up appointment in: 6-8 WEEKS    Any Other Special Instructions Will Be Listed Below (If Applicable).     If you need a refill on your cardiac medications before your next appointment, please call your pharmacy.
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
## 2015-12-09 ENCOUNTER — Ambulatory Visit (INDEPENDENT_AMBULATORY_CARE_PROVIDER_SITE_OTHER): Payer: 59 | Admitting: Pharmacist

## 2015-12-09 ENCOUNTER — Other Ambulatory Visit: Payer: Self-pay | Admitting: Family Medicine

## 2015-12-09 DIAGNOSIS — I2692 Saddle embolus of pulmonary artery without acute cor pulmonale: Secondary | ICD-10-CM | POA: Diagnosis not present

## 2015-12-09 DIAGNOSIS — I82409 Acute embolism and thrombosis of unspecified deep veins of unspecified lower extremity: Secondary | ICD-10-CM

## 2015-12-09 LAB — COAGUCHEK XS/INR WAIVED
INR: 2.1 — AB (ref 0.9–1.1)
Prothrombin Time: 25 s

## 2015-12-09 MED ORDER — WARFARIN SODIUM 7.5 MG PO TABS: 7.5000 mg | ORAL_TABLET | Freq: Every day | ORAL | 5 refills | Status: DC

## 2015-12-09 MED ORDER — IPRATROPIUM-ALBUTEROL 18-103 MCG/ACT IN AERO: 2.0000 | INHALATION_SPRAY | Freq: Four times a day (QID) | RESPIRATORY_TRACT | 11 refills | Status: DC

## 2015-12-09 NOTE — Patient Instructions (Signed)
Anticoagulation Dose Instructions as of 12/09/2015      Dorene Grebe Tue Wed Thu Fri Sat   New Dose 11.25 mg 15 mg 11.25 mg 11.25 mg 11.25 mg 15 mg 11.25 mg    Description   Increase warfarin dose to 2 tablet = 15mg  on Monday and Friday.  Take 1 and 1/2 tablets all other days.  INR today 2.1 (pt says goal 2.3-3)

## 2015-12-19 ENCOUNTER — Ambulatory Visit (HOSPITAL_COMMUNITY)
Admission: RE | Admit: 2015-12-19 | Discharge: 2015-12-19 | Disposition: A | Discharge status: Home / Self Care | Payer: 59 | Source: Ambulatory Visit | Attending: Cardiology | Admitting: Cardiology

## 2015-12-19 DIAGNOSIS — M79604 Pain in right leg: Secondary | ICD-10-CM | POA: Insufficient documentation

## 2015-12-19 DIAGNOSIS — R0602 Shortness of breath: Secondary | ICD-10-CM | POA: Insufficient documentation

## 2015-12-19 DIAGNOSIS — M79605 Pain in left leg: Secondary | ICD-10-CM | POA: Diagnosis not present

## 2015-12-19 LAB — ECHOCARDIOGRAM COMPLETE
CHL CUP DOP CALC LVOT VTI: 19.8 cm
CHL CUP STROKE VOLUME: 41 mL
EERAT: 7.26
EWDT: 268 ms
FS: 27 % — AB (ref 28–44)
IVS/LV PW RATIO, ED: 0.89
LA ID, A-P, ES: 37 mm
LA diam end sys: 37 mm
LA diam index: 1.33 cm/m2
LA vol index: 17.5 mL/m2
LA vol: 48.9 mL
LAVOLA4C: 43.6 mL
LDCA: 4.52 cm2
LV E/e' medial: 7.26
LV E/e'average: 7.26
LV PW d: 10.2 mm — AB (ref 0.6–1.1)
LV TDI E'MEDIAL: 7.29
LV dias vol: 77 mL (ref 62–150)
LV e' LATERAL: 10.2 cm/s
LV sys vol index: 13 mL/m2
LV sys vol: 36 mL
LVDIAVOLIN: 28 mL/m2
LVOT SV: 89 mL
LVOT peak grad rest: 4 mmHg
LVOT peak vel: 94.5 cm/s
LVOTD: 24 mm
MV Dec: 268
MV Peak grad: 2 mmHg
MVPKAVEL: 56 m/s
MVPKEVEL: 74.1 m/s
RV LATERAL S' VELOCITY: 10.4 cm/s
RV TAPSE: 17.1 mm
Simpson's disk: 53
TDI e' lateral: 10.2

## 2015-12-19 NOTE — Progress Notes (Signed)
*  PRELIMINARY RESULTS* Echocardiogram 2D Echocardiogram has been performed.  Samuel Germany 12/19/2015, 9:34 AM

## 2015-12-23 ENCOUNTER — Ambulatory Visit (INDEPENDENT_AMBULATORY_CARE_PROVIDER_SITE_OTHER): Payer: 59 | Admitting: Pharmacist Clinician (PhC)/ Clinical Pharmacy Specialist

## 2015-12-23 DIAGNOSIS — I2692 Saddle embolus of pulmonary artery without acute cor pulmonale: Secondary | ICD-10-CM

## 2015-12-23 DIAGNOSIS — I82409 Acute embolism and thrombosis of unspecified deep veins of unspecified lower extremity: Secondary | ICD-10-CM | POA: Diagnosis not present

## 2015-12-23 LAB — COAGUCHEK XS/INR WAIVED
INR: 2.2 — AB (ref 0.9–1.1)
Prothrombin Time: 26.2 s

## 2015-12-23 MED ORDER — WARFARIN SODIUM 7.5 MG PO TABS: 7.5000 mg | ORAL_TABLET | Freq: Every day | ORAL | 6 refills | Status: DC

## 2015-12-23 NOTE — Patient Instructions (Addendum)
.   Anticoagulation Dose Instructions as of 12/23/2015      Kevin Beck Tue Wed Thu Fri Sat   New Dose 11.25 mg 15 mg 11.25 mg 11.25 mg 11.25 mg 15 mg 11.25 mg    Description   Continue taking above schedule  INR today 2.2

## 2015-12-26 ENCOUNTER — Telehealth: Payer: Self-pay

## 2015-12-26 NOTE — Telephone Encounter (Signed)
No answer -no voice mail set up. 

## 2015-12-26 NOTE — Telephone Encounter (Signed)
-----   Message from Arnoldo Lenis, MD sent at 12/26/2015  3:04 PM EST ----- Normal circulation in legs, his pain is not related to poor circulation, he needs to discuss other possible causes wit pcp  Zandra Abts MD

## 2015-12-27 ENCOUNTER — Ambulatory Visit (INDEPENDENT_AMBULATORY_CARE_PROVIDER_SITE_OTHER): Payer: 59 | Admitting: Family Medicine

## 2015-12-27 ENCOUNTER — Encounter: Payer: Self-pay | Admitting: Family Medicine

## 2015-12-27 VITALS — BP 116/75 | HR 87 | Temp 97.3°F | Resp 20 | Ht 79.0 in | Wt 298.2 lb

## 2015-12-27 DIAGNOSIS — E876 Hypokalemia: Secondary | ICD-10-CM

## 2015-12-27 DIAGNOSIS — R0602 Shortness of breath: Secondary | ICD-10-CM

## 2015-12-27 DIAGNOSIS — J984 Other disorders of lung: Secondary | ICD-10-CM | POA: Insufficient documentation

## 2015-12-27 DIAGNOSIS — G549 Nerve root and plexus disorder, unspecified: Secondary | ICD-10-CM | POA: Diagnosis not present

## 2015-12-27 DIAGNOSIS — R05 Cough: Secondary | ICD-10-CM | POA: Diagnosis not present

## 2015-12-27 DIAGNOSIS — R059 Cough, unspecified: Secondary | ICD-10-CM
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
## 2015-12-27 NOTE — Patient Instructions (Signed)
Decrease gabapentin by one half pill twice daily until discontinued.

## 2015-12-28 ENCOUNTER — Other Ambulatory Visit: Payer: Self-pay | Admitting: Family Medicine

## 2015-12-28 LAB — CBC WITH DIFFERENTIAL/PLATELET
Basophils Absolute: 0.1 10*3/uL (ref 0.0–0.2)
Basos: 1 %
EOS (ABSOLUTE): 0.2 10*3/uL (ref 0.0–0.4)
Eos: 4 %
Hematocrit: 47.1 % (ref 37.5–51.0)
Hemoglobin: 16.8 g/dL (ref 12.6–17.7)
Immature Grans (Abs): 0 10*3/uL (ref 0.0–0.1)
Immature Granulocytes: 0 %
Lymphocytes Absolute: 1.8 10*3/uL (ref 0.7–3.1)
Lymphs: 36 %
MCH: 31.5 pg (ref 26.6–33.0)
MCHC: 35.7 g/dL (ref 31.5–35.7)
MCV: 88 fL (ref 79–97)
Monocytes Absolute: 0.5 10*3/uL (ref 0.1–0.9)
Monocytes: 10 %
Neutrophils Absolute: 2.4 10*3/uL (ref 1.4–7.0)
Neutrophils: 49 %
Platelets: 178 10*3/uL (ref 150–379)
RBC: 5.34 x10E6/uL (ref 4.14–5.80)
RDW: 13.9 % (ref 12.3–15.4)
WBC: 4.9 10*3/uL (ref 3.4–10.8)

## 2015-12-28 LAB — BMP8+EGFR
BUN/Creatinine Ratio: 15 (ref 9–20)
BUN: 12 mg/dL (ref 6–24)
CO2: 23 mmol/L (ref 18–29)
Calcium: 8.8 mg/dL (ref 8.7–10.2)
Chloride: 104 mmol/L (ref 96–106)
Creatinine, Ser: 0.82 mg/dL (ref 0.76–1.27)
GFR calc Af Amer: 121 mL/min/{1.73_m2} (ref >59)
GFR calc non Af Amer: 105 mL/min/{1.73_m2} (ref >59)
Glucose: 90 mg/dL (ref 65–99)
Potassium: 4.3 mmol/L (ref 3.5–5.2)
Sodium: 142 mmol/L (ref 134–144)

## 2015-12-28 LAB — VITAMIN B12: Vitamin B-12: 573 pg/mL (ref 211–946)

## 2015-12-28 LAB — VITAMIN D 25 HYDROXY (VIT D DEFICIENCY, FRACTURES): VIT D 25 HYDROXY: 21.1 ng/mL — AB (ref 30.0–100.0)

## 2015-12-28 MED ORDER — VITAMIN D (ERGOCALCIFEROL) 1.25 MG (50000 UNIT) PO CAPS: 50000.0000 [IU] | ORAL_CAPSULE | ORAL | 0 refills | Status: DC

## 2016-01-18 ENCOUNTER — Ambulatory Visit (INDEPENDENT_AMBULATORY_CARE_PROVIDER_SITE_OTHER): Payer: 59 | Admitting: Pharmacist

## 2016-01-18 DIAGNOSIS — I2692 Saddle embolus of pulmonary artery without acute cor pulmonale: Secondary | ICD-10-CM | POA: Diagnosis not present

## 2016-01-18 DIAGNOSIS — I82409 Acute embolism and thrombosis of unspecified deep veins of unspecified lower extremity: Secondary | ICD-10-CM | POA: Diagnosis not present

## 2016-01-18 LAB — COAGUCHEK XS/INR WAIVED
INR: 1.7 — AB (ref 0.9–1.1)
PROTHROMBIN TIME: 20.6 s

## 2016-01-23 ENCOUNTER — Ambulatory Visit: Payer: Self-pay | Admitting: Cardiology

## 2016-01-23 ENCOUNTER — Encounter: Payer: Self-pay | Admitting: Cardiology
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
## 2016-01-26 ENCOUNTER — Ambulatory Visit (INDEPENDENT_AMBULATORY_CARE_PROVIDER_SITE_OTHER): Payer: 59 | Admitting: Adult Health

## 2016-01-26 ENCOUNTER — Encounter: Payer: Self-pay | Admitting: Adult Health

## 2016-01-26 VITALS — BP 132/78 | HR 85 | Ht 79.0 in | Wt 301.0 lb

## 2016-01-26 DIAGNOSIS — R072 Precordial pain: Secondary | ICD-10-CM | POA: Diagnosis not present

## 2016-01-26 DIAGNOSIS — N528 Other male erectile dysfunction: Secondary | ICD-10-CM

## 2016-01-26 DIAGNOSIS — R0602 Shortness of breath: Secondary | ICD-10-CM

## 2016-01-26 DIAGNOSIS — I2602 Saddle embolus of pulmonary artery with acute cor pulmonale: Secondary | ICD-10-CM | POA: Diagnosis not present

## 2016-01-26 DIAGNOSIS — I2782 Chronic pulmonary embolism: Secondary | ICD-10-CM
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
## 2016-01-26 NOTE — Progress Notes (Signed)
Name: Kevin Beck    DOB: Aug 07, 1965  Age: 50 y.o.  MR#: [id number]       PCP:  [name], MD      Insurance: Payor: [employer] / Plan: [place] [employer] / Product Type: *No Product type* /   CC:   No chief complaint on file.   VS Vitals:   01/26/16 1447  BP: 132/78  Pulse: 85  SpO2: 95%  Height: 6\' 7"  (2.007 m)    Weights Current Weight  12/27/15 298 lb 3.2 oz (135.3 kg)  12/07/15 300 lb (136.1 kg)  11/23/15 (!) 303 lb (137.4 kg)    Blood Pressure  BP Readings from Last 3 Encounters:  01/26/16 132/78  12/27/15 116/75  12/07/15 118/76     Admit date:  (Not on file) Last encounter with RMR:  Visit date not found   Allergy Penicillins  Current Outpatient Prescriptions  Medication Sig Dispense Refill  . nitroGLYCERIN (NITROSTAT) 0.4 MG SL tablet Place 0.4 mg under the tongue every 5 (five) minutes as needed for chest pain. Reported on 02/22/2015    . Vitamin D, Ergocalciferol, (DRISDOL) 50000 units CAPS capsule Take 1 capsule (50,000 Units total) by mouth 2 (two) times a week. 16 capsule 0  . warfarin (COUMADIN) 7.5 MG tablet Take 1 tablet (7.5 mg total) by mouth daily at 6 PM. Take 1 1/2 tablets a day except for on Mondays and Fridays take 2 tablets 60 tablet 6   No current facility-administered medications for this visit.     Discontinued Meds:    Medications Discontinued During This Encounter  Medication Reason  . albuterol-ipratropium (COMBIVENT) 18-103 MCG/ACT inhaler Error    Patient Active Problem List   Diagnosis Date Noted  . Chronic restrictive lung disease 12/27/2015  . Cough 10/31/2015  . GERD (gastroesophageal reflux disease) 10/31/2015  . Tobacco abuse 10/31/2015  . Solitary pulmonary nodule 10/31/2015  . Deep vein thrombosis (DVT) of lower extremity ([place]) 09/06/2015  . Acute saddle pulmonary embolism without acute cor pulmonale ([place]) 09/06/2015  . Long term current use of anticoagulant therapy 06/27/2015  . SOB (shortness of breath)  06/08/2015  . Decreased potassium in the blood 12/31/2014  . 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    LABS    Component Value Date/Time   NA 142 12/27/2015 0932   NA 142 11/04/2015 2020   NA 141 11/04/2015 2012   NA 143 08/29/2015 1054   NA 141 02/04/2015 1125   K 4.3 12/27/2015 0932   K 4.0 11/04/2015 2020   K 4.1 11/04/2015 2012   CL 104 12/27/2015 0932   CL 105 11/04/2015 2020   CL 107 11/04/2015 2012   CO2 23 12/27/2015 0932   CO2 28 11/04/2015 2012   CO2 24 08/29/2015 1054   GLUCOSE 90 12/27/2015 0932   GLUCOSE 93 11/04/2015 2020   GLUCOSE 98 11/04/2015 2012   GLUCOSE 101 (H) 08/29/2015 1054   GLUCOSE 110 (H) 02/04/2015 1125   GLUCOSE 97 07/14/2014 1152   BUN 12 12/27/2015 0932   BUN 12 11/04/2015 2020   BUN 11 11/04/2015 2012   BUN 12 08/29/2015 1054   BUN 10 02/04/2015 1125   CREATININE 0.82 12/27/2015 0932   CREATININE 1.10 11/04/2015 2020   CREATININE 1.02 11/04/2015 2012   CALCIUM 8.8 12/27/2015 0932   CALCIUM 8.8 (L) 11/04/2015 2012   CALCIUM 9.2 08/29/2015 1054   GFRNONAA 105 12/27/2015 0932   GFRNONAA >60 11/04/2015 2012   GFRNONAA 93 08/29/2015 1054   GFRAA  121 12/27/2015 0932   GFRAA >60 11/04/2015 2012   GFRAA 107 08/29/2015 1054   CMP     Component Value Date/Time   NA 142 12/27/2015 0932   K 4.3 12/27/2015 0932   CL 104 12/27/2015 0932   CO2 23 12/27/2015 0932   GLUCOSE 90 12/27/2015 0932   GLUCOSE 93 11/04/2015 2020   BUN 12 12/27/2015 0932   CREATININE 0.82 12/27/2015 0932   CALCIUM 8.8 12/27/2015 0932   PROT 6.5 11/04/2015 2012   PROT 6.9 08/29/2015 1054   ALBUMIN 3.5 11/04/2015 2012   ALBUMIN 4.1 08/29/2015 1054   AST 21 11/04/2015 2012   ALT 30 11/04/2015 2012   ALKPHOS 106 11/04/2015 2012   BILITOT 0.3 11/04/2015 2012   BILITOT 0.3 08/29/2015 1054   GFRNONAA 105 12/27/2015 0932   GFRAA 121 12/27/2015 0932       Component Value Date/Time   WBC 4.9 12/27/2015 0932   WBC 6.2 11/04/2015 2012   WBC 5.3 08/29/2015 1054   WBC 6.5  01/12/2015 1141   WBC 5.2 07/14/2014 1152   WBC 6.6 03/16/2014 1237   HGB 15.6 11/04/2015 2020   HGB 15.9 11/04/2015 2012   HGB 15.6 07/14/2014 1152   HCT 47.1 12/27/2015 0932   HCT 46.0 11/04/2015 2020   HCT 47.6 11/04/2015 2012   HCT 47.9 08/29/2015 1054   HCT 37.4 (L) 01/12/2015 1141   MCV 88 12/27/2015 0932   MCV 92.1 11/04/2015 2012   MCV 90 08/29/2015 1054   MCV 88 01/12/2015 1141    Lipid Panel     Component Value Date/Time   CHOL 174 08/29/2015 1054   TRIG 134 08/29/2015 1054   HDL 40 08/29/2015 1054   CHOLHDL 4.4 08/29/2015 1054   LDLCALC 107 (H) 08/29/2015 1054    ABG    Component Value Date/Time   TCO2 26 11/04/2015 2020     No results found for: TSH BNP (last 3 results) No results for input(s): BNP in the last 8760 hours.  ProBNP (last 3 results)  Recent Labs  11/16/15 1203  PROBNP 25.0    Cardiac Panel (last 3 results) No results for input(s): CKTOTAL, CKMB, TROPONINI, RELINDX in the last 72 hours.  Iron/TIBC/Ferritin/ %Sat No results found for: IRON, TIBC, FERRITIN, IRONPCTSAT   EKG Orders placed or performed during the hospital encounter of 11/04/15  . EKG 12-Lead  . EKG 12-Lead  . Repeat EKG  . Repeat EKG  . EKG     Prior Assessment and Plan Problem List as of 01/26/2016 Reviewed: 12/27/2015  9:21 AM by [name], MD     Cardiovascular and Mediastinum   Deep vein thrombosis (DVT) of lower extremity ([place])   Acute saddle pulmonary embolism without acute cor pulmonale [facility])   Last Assessment & Plan 11/16/2015 Office Visit Written 11/16/2015 11:40 AM by Juanito Doom, MD    Very confusing history. He now tells me that after he was discharged from [facility] he was readmitted with another pulmonary embolism. Apparently the second pulmonary embolism occurred while he was on Xarelto so he was changed to warfarin. Sounds as if he had a hypercoagulability workup performed by pulmonary team at [facility].  Recent VQ scan  shows no evidence of ongoing clot.  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  Plan: Continue lifelong warfarin for now I will try to reach out to  his pulmonologist at [facility], though he has no idea who that was so we'll have to check his records        Respiratory   Chronic restrictive lung disease     Digestive   GERD (gastroesophageal reflux disease)   Last Assessment & Plan 10/31/2015 Office Visit Written 10/31/2015 11:32 AM by Juanito Doom, MD    As above        Nervous and Auditory   Myeloradiculopathy     Other   Decreased potassium in the blood   SOB (shortness of breath)   Last Assessment & Plan 11/16/2015 Office Visit Written 11/16/2015 11:39 AM by Juanito Doom, MD    Recent episode of dyspnea may have been due to pulmonary edema based on my review of the CT scan of his chest that he had at [facility] on 11/04/2015.  His persistent shortness of breath is complicated, likely due to deconditioning and obesity. However, lung function today suggested air-trapping but was not diagnostic of COPD. It's possible he has some degree of small airways disease based on his long smoking.  Plan: ProBNP today Made a repeat echo or cardiology evaluation Combivent as needed shortness of breath Stop smoking      Long term current use of anticoagulant therapy   Cough   Last Assessment & Plan 10/31/2015 Office Visit Written 10/31/2015 11:32 AM by Juanito Doom, MD    His persistent cough with mucus production is definitely due to ongoing tobacco use but I think acid reflux related to his hiatal hernia is also contributing to a significant degree. He notes ongoing acid reflux symptoms.  Plan: Counseled to take a Pepcid twice a day and given acid reflux reducing lifestyle recommendations Quit smoking      Tobacco abuse   Last Assessment & Plan  11/16/2015 Office Visit Written 11/16/2015 11:40 AM by Juanito Doom, MD    Counseled at length to quit smoking today.      Solitary pulmonary nodule   Last Assessment & Plan 11/16/2015 Office Visit Written 11/16/2015 11:41 AM by Juanito Doom, MD    He's had many, many CT scans in the last 12 months. Many have been performed at [facility] as well as here. At this point the 4 mm nodule in his left lower lobe does not need to be followed again until one year from now (September 2018) to sees with a noncontrast CT scan of the chest.          Imaging: No results found.

## 2016-01-26 NOTE — Patient Instructions (Signed)
Your physician has requested that you have en exercise stress myoview. For further information please visit [email or web address]. Please follow instruction sheet, as given.    Make apt for fu after test     Your physician recommends that you continue on your current medications as directed. Please refer to the Current Medication list given to you today.       Thank you for choosing [facility] !

## 2016-01-27 ENCOUNTER — Ambulatory Visit (INDEPENDENT_AMBULATORY_CARE_PROVIDER_SITE_OTHER): Payer: 59 | Admitting: Family Medicine

## 2016-01-27 ENCOUNTER — Encounter: Payer: Self-pay | Admitting: Family Medicine

## 2016-01-27 DIAGNOSIS — I2692 Saddle embolus of pulmonary artery without acute cor pulmonale: Secondary | ICD-10-CM | POA: Diagnosis not present

## 2016-01-27 DIAGNOSIS — I82409 Acute embolism and thrombosis of unspecified deep veins of unspecified lower extremity: Secondary | ICD-10-CM

## 2016-01-27 LAB — COAGUCHEK XS/INR WAIVED
INR: 4.4 — AB (ref 0.9–1.1)
PROTHROMBIN TIME: 52.5 s

## 2016-01-27 MED ORDER — WARFARIN SODIUM 7.5 MG PO TABS: 7.5000 mg | ORAL_TABLET | Freq: Every day | ORAL | 1 refills | Status: DC

## 2016-01-27 MED ORDER — DICLOFENAC SODIUM 75 MG PO TBEC: 75.0000 mg | DELAYED_RELEASE_TABLET | Freq: Two times a day (BID) | ORAL | 2 refills | Status: DC
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
## 2016-01-30 ENCOUNTER — Encounter (HOSPITAL_COMMUNITY)
Admission: RE | Admit: 2016-01-30 | Discharge: 2016-01-30 | Disposition: A | Discharge status: Home / Self Care | Payer: 59 | Source: Ambulatory Visit | Attending: Adult Health | Admitting: Adult Health

## 2016-01-30 ENCOUNTER — Inpatient Hospital Stay (HOSPITAL_COMMUNITY): Admission: RE | Admit: 2016-01-30 | Payer: Self-pay | Source: Ambulatory Visit

## 2016-01-30 ENCOUNTER — Encounter (HOSPITAL_COMMUNITY): Payer: Self-pay

## 2016-01-30 DIAGNOSIS — R072 Precordial pain: Secondary | ICD-10-CM | POA: Insufficient documentation

## 2016-01-30 DIAGNOSIS — R0602 Shortness of breath: Secondary | ICD-10-CM | POA: Insufficient documentation

## 2016-01-30 MED ORDER — SODIUM CHLORIDE 0.9% FLUSH
INTRAVENOUS | Status: AC
  Administered 2016-01-30: 10 mL via INTRAVENOUS
  Filled 2016-01-30: qty 10

## 2016-01-30 MED ORDER — TECHNETIUM TC 99M TETROFOSMIN IV KIT
10.0000 | PACK | Freq: Once | INTRAVENOUS | Status: AC | PRN
  Administered 2016-01-30: 10.1 via INTRAVENOUS

## 2016-01-30 MED ORDER — REGADENOSON 0.4 MG/5ML IV SOLN
INTRAVENOUS | Status: AC
  Administered 2016-01-30: 0.4 mg via INTRAVENOUS
  Filled 2016-01-30: qty 5

## 2016-01-30 MED ORDER — TECHNETIUM TC 99M TETROFOSMIN IV KIT
30.0000 | PACK | Freq: Once | INTRAVENOUS | Status: AC | PRN
  Administered 2016-01-30: 30.5 via INTRAVENOUS

## 2016-01-31 LAB — NM MYOCAR MULTI W/SPECT W/WALL MOTION / EF
CHL CUP NUCLEAR SDS: 1
CHL CUP NUCLEAR SRS: 0
CHL CUP RESTING HR STRESS: 70 {beats}/min
CSEPEDS: 44 s
CSEPEW: 10.4 METS
CSEPHR: 79 %
CSEPPHR: 136 {beats}/min
Exercise duration (min): 7 min
LHR: 0.31
LVDIAVOL: 111 mL (ref 62–150)
LVSYSVOL: 46 mL
MPHR: 172 {beats}/min
RPE: 16
SSS: 1
TID: 0.9

## 2016-02-01 ENCOUNTER — Other Ambulatory Visit: Payer: Self-pay | Admitting: Pharmacist

## 2016-02-01 MED ORDER — WARFARIN SODIUM 7.5 MG PO TABS: 11.2500 mg | ORAL_TABLET | Freq: Every day | ORAL | 1 refills | Status: DC

## 2016-02-06 ENCOUNTER — Encounter: Payer: Self-pay | Admitting: Pulmonary Disease

## 2016-02-06 ENCOUNTER — Ambulatory Visit (INDEPENDENT_AMBULATORY_CARE_PROVIDER_SITE_OTHER): Payer: 59 | Admitting: Pulmonary Disease

## 2016-02-06 DIAGNOSIS — J984 Other disorders of lung: Secondary | ICD-10-CM

## 2016-02-06 MED ORDER — FLUTICASONE FUROATE-VILANTEROL 100-25 MCG/INH IN AEPB: 1.0000 | INHALATION_SPRAY | Freq: Every day | RESPIRATORY_TRACT | 0 refills | Status: DC

## 2016-02-06 NOTE — Progress Notes (Signed)
Subjective:    Patient ID: Kevin Beck, male    DOB: Sep 28, 1965, 50 y.o.   MRN: [id number]  Synopsis: Referred in 2017 for evaluation of shortness of breath after a large pulmonary embolism in 2016.  He is a life long smoker and started smoking age 56-9.  He continues to smoke at age 63 as of 10/2015.  Records from [facility] showed that he had an emergent thrombectomy for massive pulmonary embolism in November 2016. A PFO was closed at that time (Dr. Zannie Kehr).  He was discharged on Xarelto but was re-admitted with recurrent clot in 2016 so he was started on warfarin at that time.  He had an IVC filter placed for about 7 months at this time.       HPI Chief Complaint  Patient presents with  . Follow-up    pt c/o worsening sob worse qam, prod cough with yellow mucus.     Kevin Beck says he still coughs up mucus in the mornings and has a severe cough in the mornings.  He wasn't able to finish his stress test recently due to severe dyspnea.   He was recently prescribed   He is still smoking 1/2 pack per day.  He wheezes when he lay flat at night.  He has some chest congestion while walking around.  He says that he will sometimes cough up a lt of mucus in the middle of the night.  He has never been told that he had asthma.    Past Medical History:  Diagnosis Date  . Bronchitis   . Headache   . Pneumonia 2008      Review of Systems  Constitutional: Negative for chills, fatigue and fever.  HENT: Negative for postnasal drip, rhinorrhea and sore throat.   Respiratory: Positive for cough and shortness of breath. Negative for wheezing.   Cardiovascular: Negative for chest pain, palpitations and leg swelling.       Objective:   Physical Exam  Vitals:   02/06/16 1038  BP: 128/78  Pulse: 85  SpO2: 98%  Weight: (!) 308 lb (139.7 kg)  Height: 6\' 7"  (2.007 m)  RA  Gen: well appearing HENT: OP clear, TM's clear, neck supple PULM: CTA B, normal percussion CV: RRR,  no mgr, trace edema GI: BS+, soft, nontender Derm: no cyanosis or rash Psyche: normal mood and affect   PFT: September 2017 pulmonary function testing FEV1 to FVC ratio 82%, FEV1 3.31 L 65% predicted, FVC 4.04 L 61% predicted, total lung capacity 6.18 L 72% predicted, residual volume 95% predicted, DLCO 26.5 761% predicted  Images  May 2017 CT scan of the chest reviewed showing a 4.5 mm nodule in the right lower lobe, no obvious pulmonary parenchymal abnormality September 2017 CT angiogram chest images personally reviewed showing possible atelectasis in the lower lobes versus dependent edema airways appear normal, pulmonary nodule 4 mm in size in the left lower lobe, no angiographic evidence of embolism   Echo: Echocardiogram from November 2016 at [facility] shows normal RV size and function, normal LV size and function. October 2017 echocardiogram showed an LVEF of 50-55% with hypokinesis of the basal midanterior myocardial septum, RV without clear evidence of elevated PA pressure, RV size and function were normal  Other images: 10/2015 V/Q > negative December 2017 nuclear stress test was read as a low risk study.      Assessment & Plan:  Small airways disease Kevin Beck does not have COPD but I  worry that he is headed in that direction with his ongoing tobacco abuse. He notes daily mucus production in the mornings and significant chest congestion throughout the daytime. I explained to him at length today that these are all symptoms of ongoing tobacco abuse. His lung function testing in September 2017 was not consistent with COPD but there was a suggestion of air trapping which can be seen with small airways disease from ongoing tobacco use. It's unclear if these patients benefit from inhaled therapy.  Plan: Trial of Breo Stop smoking Follow-up with nurse practitioner in 6 weeks to see how he is doing  Greater than 50% of this 27 minute visit spent face-to-face  are    Current Outpatient Prescriptions:  .  cholecalciferol (VITAMIN D) 1000 units tablet, Take 1,000 Units by mouth daily., Disp: , Rfl:  .  diclofenac (VOLTAREN) 75 MG EC tablet, Take 1 tablet (75 mg total) by mouth 2 (two) times daily. For muscle and  Joint pain, Disp: 60 tablet, Rfl: 2 .  nitroGLYCERIN (NITROSTAT) 0.4 MG SL tablet, Place 0.4 mg under the tongue every 5 (five) minutes as needed for chest pain. Reported on 02/22/2015, Disp: , Rfl:  .  warfarin (COUMADIN) 7.5 MG tablet, Take 1.5 tablets (11.25 mg total) by mouth daily at 6 PM., Disp: 45 tablet, Rfl: 1 .  fluticasone furoate-vilanterol (BREO ELLIPTA) 100-25 MCG/INH AEPB, Inhale 1 puff into the lungs daily., Disp: 2 each, Rfl: 0

## 2016-02-06 NOTE — Assessment & Plan Note (Signed)
Kevin Beck does not have COPD but I worry that he is headed in that direction with his ongoing tobacco abuse. He notes daily mucus production in the mornings and significant chest congestion throughout the daytime. I explained to him at length today that these are all symptoms of ongoing tobacco abuse. His lung function testing in September 2017 was not consistent with COPD but there was a suggestion of air trapping which can be seen with small airways disease from ongoing tobacco use. It's unclear if these patients benefit from inhaled therapy.  Plan: Trial of Breo Stop smoking Follow-up with nurse practitioner in 6 weeks to see how he is doing  Greater than 50% of this 27 minute visit spent face-to-face are

## 2016-02-06 NOTE — Patient Instructions (Signed)
Stop smoking Take Breo 1 puff daily Call us after you have taken the [facility] for a week and let us know if it is helping, then we will call in a prescription  We will see you back with our nurse practitioner in 6 weeks

## 2016-02-07 ENCOUNTER — Encounter: Payer: Self-pay | Admitting: Adult Health

## 2016-02-07 ENCOUNTER — Ambulatory Visit: Payer: Self-pay | Admitting: Adult Health
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
## 2016-02-08 ENCOUNTER — Encounter: Payer: Self-pay | Admitting: Family Medicine

## 2016-02-08 ENCOUNTER — Ambulatory Visit (INDEPENDENT_AMBULATORY_CARE_PROVIDER_SITE_OTHER): Payer: 59 | Admitting: Family Medicine

## 2016-02-08 VITALS — BP 115/80 | HR 82 | Temp 97.2°F | Ht 79.0 in | Wt 303.0 lb

## 2016-02-08 DIAGNOSIS — R059 Cough, unspecified: Secondary | ICD-10-CM

## 2016-02-08 DIAGNOSIS — Z720 Tobacco use: Secondary | ICD-10-CM

## 2016-02-08 DIAGNOSIS — I82409 Acute embolism and thrombosis of unspecified deep veins of unspecified lower extremity: Secondary | ICD-10-CM | POA: Diagnosis not present

## 2016-02-08 DIAGNOSIS — G549 Nerve root and plexus disorder, unspecified: Secondary | ICD-10-CM | POA: Diagnosis not present

## 2016-02-08 DIAGNOSIS — R05 Cough: Secondary | ICD-10-CM | POA: Diagnosis not present

## 2016-02-08 LAB — COAGUCHEK XS/INR WAIVED
INR: 2 — ABNORMAL HIGH (ref 0.9–1.1)
PROTHROMBIN TIME: 24.1 s

## 2016-02-08 MED ORDER — PREDNISONE 10 MG PO TABS: ORAL_TABLET | ORAL | 0 refills | Status: DC

## 2016-02-08 MED ORDER — CYCLOBENZAPRINE HCL 10 MG PO TABS: 10.0000 mg | ORAL_TABLET | Freq: Three times a day (TID) | ORAL | 1 refills | Status: DC | PRN
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
## 2016-02-16 IMAGING — RF DG CERVICAL SPINE 2 OR 3 VIEWS
1 series · 2 of 2 positions shown · non-contrast
Comparison: None.

CLINICAL DATA: C5-6 and C6-7 ACDF.

EXAM:
DG C-ARM 61-120 MIN; CERVICAL SPINE - 2-3 VIEW

[Series 1: run · 2 of 2 slices shown]
[im 1/2]
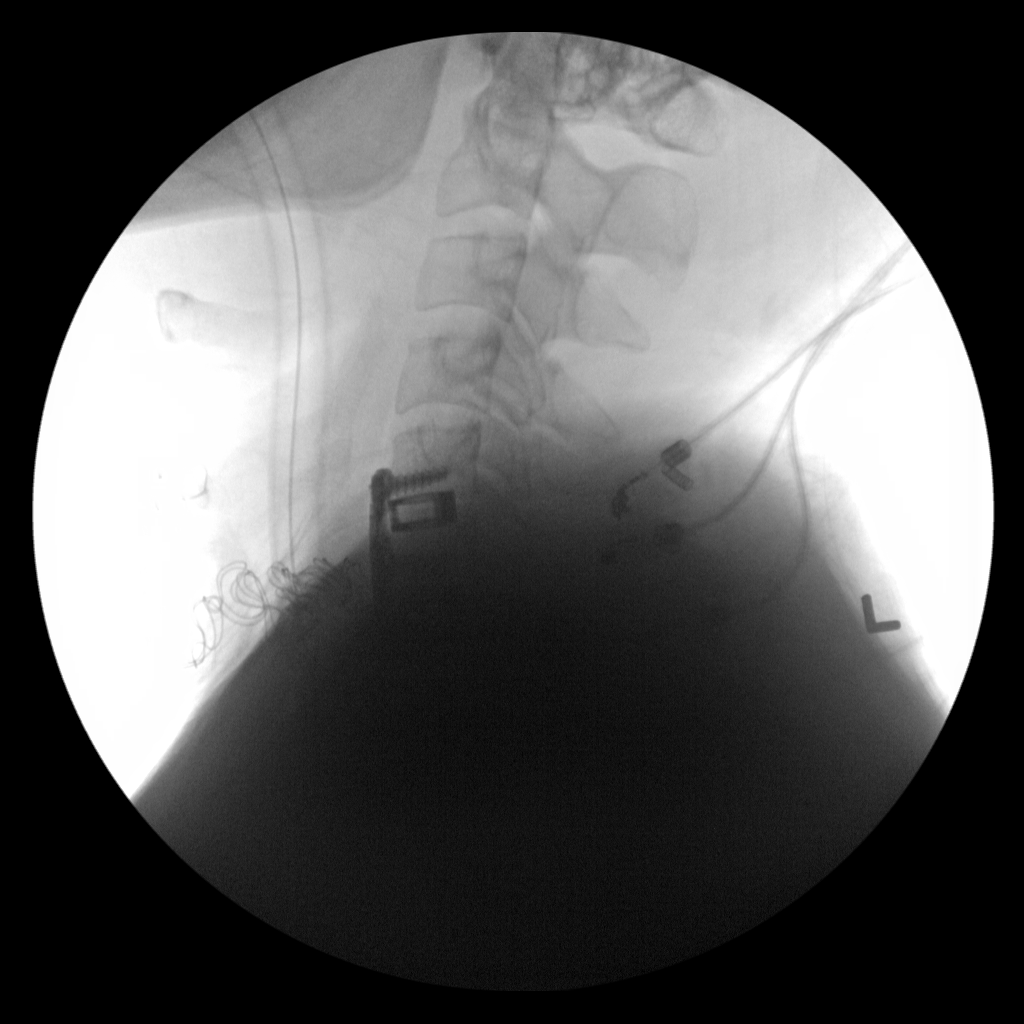
[im 2/2]
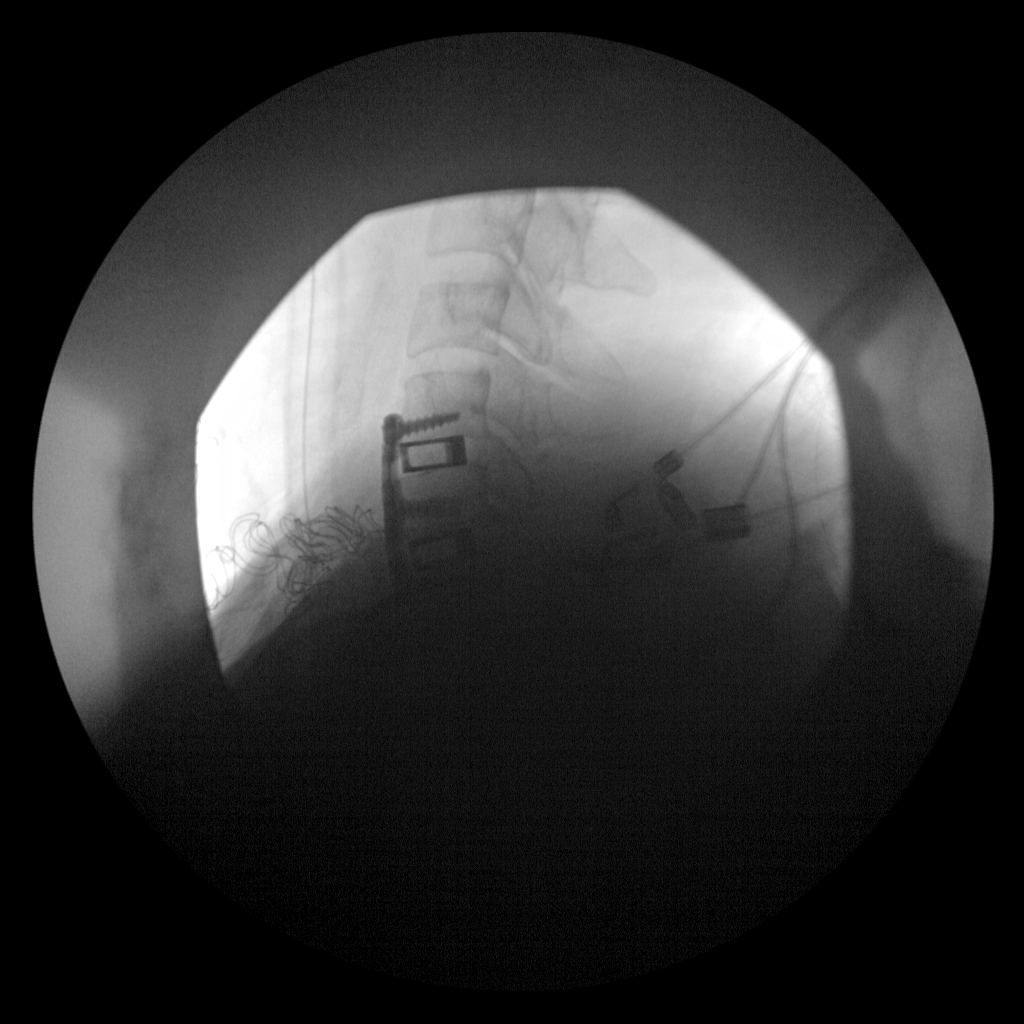

[2 of 2 positions shown; findings below may reference images not displayed]

FINDINGS: Examination demonstrates anterior fusion hardware from C5-C7 intact
and normally located. Intervertebral cages are present at the C5-6
and C6-7 disc spaces. Prevertebral soft tissues are unremarkable.
Endotracheal tube is present.
IMPRESSION: Anterior fusion hardware from C5-C7 with intervertebral cages at the
C5-6 and C6-7 levels intact.

## 2016-02-24 ENCOUNTER — Ambulatory Visit (INDEPENDENT_AMBULATORY_CARE_PROVIDER_SITE_OTHER): Payer: 59 | Admitting: Pharmacist Clinician (PhC)/ Clinical Pharmacy Specialist

## 2016-02-24 DIAGNOSIS — I82409 Acute embolism and thrombosis of unspecified deep veins of unspecified lower extremity: Secondary | ICD-10-CM

## 2016-02-24 DIAGNOSIS — I2692 Saddle embolus of pulmonary artery without acute cor pulmonale: Secondary | ICD-10-CM

## 2016-02-24 LAB — COAGUCHEK XS/INR WAIVED
INR: 1.3 — ABNORMAL HIGH (ref 0.9–1.1)
PROTHROMBIN TIME: 16.2 s

## 2016-02-24 NOTE — Patient Instructions (Signed)
Anticoagulation Dose Instructions as of 02/24/2016      Kevin Beck Tue Wed Thu Fri Sat   New Dose 11.25 mg 11.25 mg 11.25 mg 11.25 mg 11.25 mg 11.25 mg 11.25 mg    Description   Take 2 tablets today only.  Then take 1 1/2 tablets every day of the week.

## 2016-03-01 DIAGNOSIS — H52223 Regular astigmatism, bilateral: Secondary | ICD-10-CM | POA: Diagnosis not present

## 2016-03-01 DIAGNOSIS — H524 Presbyopia: Secondary | ICD-10-CM | POA: Diagnosis not present

## 2016-03-01 DIAGNOSIS — H5213 Myopia, bilateral: Secondary | ICD-10-CM | POA: Diagnosis not present

## 2016-03-12 ENCOUNTER — Ambulatory Visit (INDEPENDENT_AMBULATORY_CARE_PROVIDER_SITE_OTHER): Payer: 59

## 2016-03-12 ENCOUNTER — Ambulatory Visit (INDEPENDENT_AMBULATORY_CARE_PROVIDER_SITE_OTHER): Payer: 59 | Admitting: Family Medicine

## 2016-03-12 ENCOUNTER — Encounter: Payer: Self-pay | Admitting: Family Medicine

## 2016-03-12 VITALS — BP 117/65 | HR 73 | Temp 97.3°F | Ht 79.0 in | Wt 303.0 lb

## 2016-03-12 DIAGNOSIS — R05 Cough: Secondary | ICD-10-CM

## 2016-03-12 DIAGNOSIS — I82409 Acute embolism and thrombosis of unspecified deep veins of unspecified lower extremity: Secondary | ICD-10-CM | POA: Diagnosis not present

## 2016-03-12 DIAGNOSIS — R059 Cough, unspecified: Secondary | ICD-10-CM

## 2016-03-12 DIAGNOSIS — I2692 Saddle embolus of pulmonary artery without acute cor pulmonale: Secondary | ICD-10-CM

## 2016-03-12 LAB — COAGUCHEK XS/INR WAIVED
INR: 2.5 — ABNORMAL HIGH (ref 0.9–1.1)
PROTHROMBIN TIME: 30.3 s

## 2016-03-12 MED ORDER — RIVAROXABAN (XARELTO) VTE STARTER PACK (15 & 20 MG)
ORAL_TABLET | ORAL | 0 refills | Status: DC
Start: 2016-03-12 — End: 2016-04-02
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
## 2016-03-12 NOTE — Progress Notes (Signed)
Your chest x-ray looked normal. Thanks, WS.

## 2016-03-21 ENCOUNTER — Telehealth: Payer: Self-pay | Admitting: Cardiology

## 2016-03-21 NOTE — Progress Notes (Signed)
Received paperwork for patient's recent claim for life disability. From cardiac standpoint recent echo and nuclear stress test are normal. He had a major PE in 2016 requiring surgery and antiocoagulation, echo shows no long term effects regarding his RV function. I have deferred consideration for possible disability to his pcp as recent cardiac testing does not show objective evidence of any significant cardiac condition.     Carlyle Dolly MD

## 2016-03-22 ENCOUNTER — Ambulatory Visit: Payer: Self-pay | Admitting: Pulmonary Disease

## 2016-03-30 ENCOUNTER — Telehealth: Payer: Self-pay | Admitting: Family Medicine

## 2016-03-30 ENCOUNTER — Telehealth: Payer: Self-pay | Admitting: Cardiology

## 2016-03-30 NOTE — Telephone Encounter (Signed)
appt scheduled

## 2016-03-30 NOTE — Telephone Encounter (Signed)
Patient asked for copy of note from [facility] regarding echo and nuclear stress test results. Mailed copy to patient

## 2016-03-30 NOTE — Telephone Encounter (Signed)
Please call patient regarding work restrictions after stress test/tg

## 2016-04-02 ENCOUNTER — Encounter: Payer: Self-pay | Admitting: Family Medicine

## 2016-04-02 ENCOUNTER — Ambulatory Visit (INDEPENDENT_AMBULATORY_CARE_PROVIDER_SITE_OTHER): Payer: 59 | Admitting: Family Medicine

## 2016-04-02 VITALS — BP 127/81 | HR 84 | Temp 98.4°F | Ht 79.0 in | Wt 304.0 lb

## 2016-04-02 DIAGNOSIS — Z7901 Long term (current) use of anticoagulants: Secondary | ICD-10-CM

## 2016-04-02 DIAGNOSIS — I513 Intracardiac thrombosis, not elsewhere classified: Secondary | ICD-10-CM

## 2016-04-02 DIAGNOSIS — J984 Other disorders of lung: Secondary | ICD-10-CM | POA: Diagnosis not present

## 2016-04-02 DIAGNOSIS — G549 Nerve root and plexus disorder, unspecified: Secondary | ICD-10-CM | POA: Diagnosis not present
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
## 2016-04-12 ENCOUNTER — Ambulatory Visit: Payer: 59 | Admitting: Family Medicine

## 2016-04-12 ENCOUNTER — Encounter: Payer: 59 | Admitting: Pharmacist

## 2016-04-13 ENCOUNTER — Ambulatory Visit: Payer: Self-pay | Admitting: Pulmonary Disease

## 2016-04-18 DIAGNOSIS — R918 Other nonspecific abnormal finding of lung field: Secondary | ICD-10-CM | POA: Diagnosis not present

## 2016-04-18 DIAGNOSIS — F17210 Nicotine dependence, cigarettes, uncomplicated: Secondary | ICD-10-CM | POA: Diagnosis not present

## 2016-04-24 ENCOUNTER — Other Ambulatory Visit: Payer: Self-pay | Admitting: *Deleted

## 2016-04-24 ENCOUNTER — Telehealth: Payer: Self-pay | Admitting: Pharmacist

## 2016-04-24 ENCOUNTER — Ambulatory Visit (INDEPENDENT_AMBULATORY_CARE_PROVIDER_SITE_OTHER): Payer: 59 | Admitting: Pharmacist

## 2016-04-24 DIAGNOSIS — I2692 Saddle embolus of pulmonary artery without acute cor pulmonale: Secondary | ICD-10-CM | POA: Diagnosis not present

## 2016-04-24 DIAGNOSIS — I82409 Acute embolism and thrombosis of unspecified deep veins of unspecified lower extremity: Secondary | ICD-10-CM | POA: Diagnosis not present

## 2016-04-24 DIAGNOSIS — Z86718 Personal history of other venous thrombosis and embolism: Secondary | ICD-10-CM

## 2016-04-24 DIAGNOSIS — G8929 Other chronic pain: Secondary | ICD-10-CM

## 2016-04-24 DIAGNOSIS — Z86711 Personal history of pulmonary embolism: Secondary | ICD-10-CM

## 2016-04-24 DIAGNOSIS — Z7901 Long term (current) use of anticoagulants: Secondary | ICD-10-CM | POA: Diagnosis not present

## 2016-04-24 DIAGNOSIS — M549 Dorsalgia, unspecified: Principal | ICD-10-CM

## 2016-04-24 LAB — COAGUCHEK XS/INR WAIVED
INR: 1.5 — AB (ref 0.9–1.1)
PROTHROMBIN TIME: 18.3 s

## 2016-04-24 MED ORDER — RIVAROXABAN 10 MG PO TABS: 10.0000 mg | ORAL_TABLET | Freq: Every day | ORAL | 2 refills | Status: DC

## 2016-04-24 NOTE — Telephone Encounter (Signed)
Please Refer as requested. Thanks, WS

## 2016-04-24 NOTE — Telephone Encounter (Signed)
Referral placed.

## 2016-05-03 ENCOUNTER — Other Ambulatory Visit: Payer: Self-pay

## 2016-05-03 DIAGNOSIS — G8929 Other chronic pain: Secondary | ICD-10-CM

## 2016-05-03 DIAGNOSIS — M549 Dorsalgia, unspecified: Principal | ICD-10-CM

## 2016-05-10 ENCOUNTER — Ambulatory Visit (HOSPITAL_COMMUNITY): Admission: RE | Admit: 2016-05-10 | Payer: 59 | Source: Ambulatory Visit

## 2016-06-08 MED FILL — XARELTO 10 MG TABLET: 10 | 30 days supply | Qty: 30 | Fill #0

## 2016-06-22 ENCOUNTER — Telehealth: Payer: Self-pay | Admitting: Family Medicine

## 2016-06-22 NOTE — Telephone Encounter (Signed)
Patient is having a mole removed Monday and wants to know if he needs to stop his Xarelto before it? Please advise

## 2016-06-22 NOTE — Telephone Encounter (Signed)
On xarelto for DVT, PE. Must stay on it. He should let doctor know.

## 2016-06-22 NOTE — Telephone Encounter (Signed)
Patient aware and verbalizes understanding. 

## 2016-06-25 ENCOUNTER — Encounter: Payer: Self-pay | Admitting: Family Medicine

## 2016-06-25 ENCOUNTER — Ambulatory Visit (INDEPENDENT_AMBULATORY_CARE_PROVIDER_SITE_OTHER): Payer: 59 | Admitting: Family Medicine

## 2016-06-25 VITALS — BP 128/74 | HR 81 | Temp 97.7°F | Ht 79.0 in | Wt 287.0 lb

## 2016-06-25 DIAGNOSIS — D485 Neoplasm of uncertain behavior of skin: Secondary | ICD-10-CM
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
## 2016-06-25 NOTE — Progress Notes (Signed)
Patient ID: Kevin Beck, male   DOB: October 20, 1965, 51 y.o.   MRN: [id number]  Subjective:  Patient presents with a mole that is suspicious/irritated which they would like to have removed. It has been present for several months  Objective:  The lesion is located at the right loweer back.  It is 1.2 centimeters. It is well-circumscribed. Irritated with crust formation at medial aspect. Pulled away laterally, attached by flap.   Assessment:  Skin lesion, inflamed/suspicious of undetermined character.  Plan:  Removal via shave biopsy.  Procedure:  With sterile prep and drape the above-mentioned lesion was anesthetized using 2% lidocaine with epinephrine infiltrated subdermally. Subsequently derma blade was introduced for shave biopsy under cutting the entire lesion. Hemostasis performed with electrocautery. Wound dressed with Polysporin and gauze.   Specimen submitted for pathology.  Wound care reviewed. Signs and symptoms of infection reviewed as well.  Follow-up as needed.

## 2016-06-26 ENCOUNTER — Other Ambulatory Visit: Payer: Self-pay | Admitting: Family Medicine

## 2016-06-26 DIAGNOSIS — D485 Neoplasm of uncertain behavior of skin: Secondary | ICD-10-CM | POA: Diagnosis not present

## 2016-06-26 NOTE — Addendum Note (Signed)
Addended by: [name] on: 06/26/2016 01:32 PM   Modules accepted: Orders

## 2016-06-26 NOTE — Addendum Note (Signed)
Addended by: Marylin Crosby on: 06/26/2016 04:10 PM   Modules accepted: Orders

## 2016-06-28 LAB — PATHOLOGY

## 2016-07-02 ENCOUNTER — Telehealth: Payer: Self-pay | Admitting: Family Medicine

## 2016-07-02 ENCOUNTER — Other Ambulatory Visit: Payer: Self-pay | Admitting: *Deleted

## 2016-07-02 DIAGNOSIS — L989 Disorder of the skin and subcutaneous tissue, unspecified: Secondary | ICD-10-CM

## 2016-07-02 DIAGNOSIS — D485 Neoplasm of uncertain behavior of skin: Secondary | ICD-10-CM

## 2016-07-02 NOTE — Telephone Encounter (Signed)
Spoke with pt's wife and advised of biopsy results and that dermatology referral was placed. Pt's wife voiced understanding.

## 2016-07-12 DIAGNOSIS — L905 Scar conditions and fibrosis of skin: Secondary | ICD-10-CM | POA: Diagnosis not present

## 2016-07-12 DIAGNOSIS — L0291 Cutaneous abscess, unspecified: Secondary | ICD-10-CM | POA: Diagnosis not present

## 2016-07-12 DIAGNOSIS — L720 Epidermal cyst: Secondary | ICD-10-CM | POA: Diagnosis not present

## 2016-07-12 DIAGNOSIS — L821 Other seborrheic keratosis: Secondary | ICD-10-CM | POA: Diagnosis not present

## 2016-07-12 DIAGNOSIS — D485 Neoplasm of uncertain behavior of skin: Secondary | ICD-10-CM | POA: Diagnosis not present

## 2016-07-18 ENCOUNTER — Encounter: Payer: Self-pay | Admitting: Family Medicine

## 2016-07-18 ENCOUNTER — Telehealth: Payer: Self-pay

## 2016-07-18 ENCOUNTER — Ambulatory Visit (INDEPENDENT_AMBULATORY_CARE_PROVIDER_SITE_OTHER): Payer: 59 | Admitting: Family Medicine

## 2016-07-18 VITALS — BP 126/79 | HR 91 | Temp 97.8°F | Ht 79.0 in | Wt 285.0 lb

## 2016-07-18 DIAGNOSIS — G549 Nerve root and plexus disorder, unspecified: Secondary | ICD-10-CM | POA: Diagnosis not present

## 2016-07-18 DIAGNOSIS — Z7901 Long term (current) use of anticoagulants: Secondary | ICD-10-CM

## 2016-07-18 DIAGNOSIS — J984 Other disorders of lung: Secondary | ICD-10-CM

## 2016-07-18 DIAGNOSIS — M5416 Radiculopathy, lumbar region: Secondary | ICD-10-CM

## 2016-07-18 DIAGNOSIS — M5412 Radiculopathy, cervical region: Secondary | ICD-10-CM

## 2016-07-18 DIAGNOSIS — E559 Vitamin D deficiency, unspecified: Secondary | ICD-10-CM

## 2016-07-18 NOTE — Telephone Encounter (Signed)
Please write up the referral for lumbar, cervical radiculopathy

## 2016-07-18 NOTE — Telephone Encounter (Signed)
Patient needs a referral to Dr Luiz Ochoa (back dr). He has seen them before but their office says that he needs a referral
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
## 2016-07-19 LAB — CMP14+EGFR
A/G RATIO: 1.8 (ref 1.2–2.2)
ALBUMIN: 4.4 g/dL (ref 3.5–5.5)
ALT: 20 IU/L (ref 0–44)
AST: 17 IU/L (ref 0–40)
Alkaline Phosphatase: 120 IU/L — ABNORMAL HIGH (ref 39–117)
BUN/Creatinine Ratio: 14 (ref 9–20)
BUN: 13 mg/dL (ref 6–24)
Bilirubin Total: 0.3 mg/dL (ref 0.0–1.2)
CALCIUM: 9.2 mg/dL (ref 8.7–10.2)
CO2: 24 mmol/L (ref 18–29)
Chloride: 103 mmol/L (ref 96–106)
Creatinine, Ser: 0.94 mg/dL (ref 0.76–1.27)
GFR, EST AFRICAN AMERICAN: 110 mL/min/{1.73_m2} (ref >59)
GFR, EST NON AFRICAN AMERICAN: 95 mL/min/{1.73_m2} (ref >59)
GLOBULIN, TOTAL: 2.5 g/dL (ref 1.5–4.5)
Glucose: 94 mg/dL (ref 65–99)
POTASSIUM: 4.2 mmol/L (ref 3.5–5.2)
SODIUM: 142 mmol/L (ref 134–144)
TOTAL PROTEIN: 6.9 g/dL (ref 6.0–8.5)

## 2016-07-19 LAB — CBC WITH DIFFERENTIAL/PLATELET
BASOS: 1 %
Basophils Absolute: 0.1 10*3/uL (ref 0.0–0.2)
EOS (ABSOLUTE): 0.2 10*3/uL (ref 0.0–0.4)
EOS: 2 %
HEMATOCRIT: 47.5 % (ref 37.5–51.0)
Hemoglobin: 16.7 g/dL (ref 13.0–17.7)
IMMATURE GRANS (ABS): 0 10*3/uL (ref 0.0–0.1)
IMMATURE GRANULOCYTES: 0 %
Lymphocytes Absolute: 1.8 10*3/uL (ref 0.7–3.1)
Lymphs: 22 %
MCH: 30.6 pg (ref 26.6–33.0)
MCHC: 35.2 g/dL (ref 31.5–35.7)
MCV: 87 fL (ref 79–97)
MONOS ABS: 0.6 10*3/uL (ref 0.1–0.9)
Monocytes: 8 %
NEUTROS PCT: 67 %
Neutrophils Absolute: 5.5 10*3/uL (ref 1.4–7.0)
Platelets: 190 10*3/uL (ref 150–379)
RBC: 5.46 x10E6/uL (ref 4.14–5.80)
RDW: 13.7 % (ref 12.3–15.4)
WBC: 8.2 10*3/uL (ref 3.4–10.8)

## 2016-07-19 LAB — VITAMIN D 25 HYDROXY (VIT D DEFICIENCY, FRACTURES): Vit D, 25-Hydroxy: 26.8 ng/mL — ABNORMAL LOW (ref 30.0–100.0)

## 2016-07-19 NOTE — Telephone Encounter (Signed)
Pt notified of referral  

## 2016-07-20 ENCOUNTER — Other Ambulatory Visit: Payer: Self-pay | Admitting: Family Medicine

## 2016-07-20 MED ORDER — VITAMIN D (ERGOCALCIFEROL) 1.25 MG (50000 UNIT) PO CAPS: 50000.0000 [IU] | ORAL_CAPSULE | ORAL | 0 refills | Status: DC

## 2016-07-26 MED FILL — XARELTO 10 MG TABLET: 10 | 30 days supply | Qty: 30 | Fill #1

## 2016-08-03 MED FILL — VIT D2 1.25 MG (50,000 UNIT: 1.25 MG | 56 days supply | Qty: 16 | Fill #0

## 2016-10-16 ENCOUNTER — Ambulatory Visit (INDEPENDENT_AMBULATORY_CARE_PROVIDER_SITE_OTHER): Payer: 59 | Admitting: Family Medicine

## 2016-10-16 ENCOUNTER — Encounter: Payer: Self-pay | Admitting: Family Medicine

## 2016-10-16 VITALS — BP 114/62 | HR 78 | Temp 97.0°F | Ht 79.0 in | Wt 282.0 lb

## 2016-10-16 DIAGNOSIS — R252 Cramp and spasm: Secondary | ICD-10-CM | POA: Diagnosis not present

## 2016-10-16 DIAGNOSIS — R202 Paresthesia of skin: Secondary | ICD-10-CM | POA: Diagnosis not present

## 2016-10-16 MED ORDER — DULOXETINE HCL 60 MG PO CPEP: 60.0000 mg | ORAL_CAPSULE | Freq: Every day | ORAL | 3 refills | Status: DC

## 2016-10-16 MED ORDER — GABAPENTIN 300 MG PO CAPS: 600.0000 mg | ORAL_CAPSULE | Freq: Every day | ORAL | 3 refills | Status: DC
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
## 2016-10-16 NOTE — Patient Instructions (Signed)
When you start the Gabapentin only take 1 capsule at bedtime for the first week and then start taking 2 capsules at bedtime after the first week.

## 2016-10-17 LAB — CBC WITH DIFFERENTIAL/PLATELET
Basophils Absolute: 0.1 10*3/uL (ref 0.0–0.2)
Basos: 1 %
EOS (ABSOLUTE): 0.1 10*3/uL (ref 0.0–0.4)
Eos: 2 %
Hematocrit: 47.3 % (ref 37.5–51.0)
Hemoglobin: 16.2 g/dL (ref 13.0–17.7)
IMMATURE GRANULOCYTES: 0 %
Immature Grans (Abs): 0 10*3/uL (ref 0.0–0.1)
Lymphocytes Absolute: 1.5 10*3/uL (ref 0.7–3.1)
Lymphs: 21 %
MCH: 30.6 pg (ref 26.6–33.0)
MCHC: 34.2 g/dL (ref 31.5–35.7)
MCV: 89 fL (ref 79–97)
MONOS ABS: 0.3 10*3/uL (ref 0.1–0.9)
Monocytes: 5 %
NEUTROS PCT: 71 %
Neutrophils Absolute: 5.1 10*3/uL (ref 1.4–7.0)
PLATELETS: 178 10*3/uL (ref 150–379)
RBC: 5.3 x10E6/uL (ref 4.14–5.80)
RDW: 14.1 % (ref 12.3–15.4)
WBC: 7.1 10*3/uL (ref 3.4–10.8)

## 2016-10-17 LAB — CMP14+EGFR
A/G RATIO: 1.8 (ref 1.2–2.2)
ALT: 19 IU/L (ref 0–44)
AST: 15 IU/L (ref 0–40)
Albumin: 4.4 g/dL (ref 3.5–5.5)
Alkaline Phosphatase: 113 IU/L (ref 39–117)
BUN / CREAT RATIO: 10 (ref 9–20)
BUN: 12 mg/dL (ref 6–24)
Bilirubin Total: 0.3 mg/dL (ref 0.0–1.2)
CALCIUM: 9.3 mg/dL (ref 8.7–10.2)
CO2: 24 mmol/L (ref 20–29)
CREATININE: 1.18 mg/dL (ref 0.76–1.27)
Chloride: 102 mmol/L (ref 96–106)
GFR, EST AFRICAN AMERICAN: 84 mL/min/{1.73_m2} (ref >59)
GFR, EST NON AFRICAN AMERICAN: 73 mL/min/{1.73_m2} (ref >59)
Globulin, Total: 2.4 g/dL (ref 1.5–4.5)
Glucose: 88 mg/dL (ref 65–99)
Potassium: 4.1 mmol/L (ref 3.5–5.2)
Sodium: 142 mmol/L (ref 134–144)
Total Protein: 6.8 g/dL (ref 6.0–8.5)

## 2016-10-17 LAB — VITAMIN D 25 HYDROXY (VIT D DEFICIENCY, FRACTURES): Vit D, 25-Hydroxy: 42.9 ng/mL (ref 30.0–100.0)

## 2016-10-17 LAB — MAGNESIUM: Magnesium: 2 mg/dL (ref 1.6–2.3)

## 2016-10-17 LAB — PHOSPHORUS: Phosphorus: 2.3 mg/dL — ABNORMAL LOW (ref 2.5–4.5)

## 2016-10-29 MED FILL — XARELTO 10 MG TABLET: 10 | 30 days supply | Qty: 30 | Fill #2

## 2016-10-29 MED FILL — DULoxetine HCL 60 MG CPEP: 60 | 30 days supply | Qty: 30 | Fill #0

## 2016-11-12 ENCOUNTER — Ambulatory Visit (INDEPENDENT_AMBULATORY_CARE_PROVIDER_SITE_OTHER): Payer: 59 | Admitting: Family Medicine

## 2016-11-12 ENCOUNTER — Encounter: Payer: Self-pay | Admitting: Family Medicine

## 2016-11-12 VITALS — BP 118/62 | HR 72 | Temp 98.3°F | Ht 79.0 in | Wt 284.0 lb

## 2016-11-12 DIAGNOSIS — M5416 Radiculopathy, lumbar region: Secondary | ICD-10-CM | POA: Diagnosis not present

## 2016-11-12 DIAGNOSIS — Z7901 Long term (current) use of anticoagulants: Secondary | ICD-10-CM

## 2016-11-12 DIAGNOSIS — Z86711 Personal history of pulmonary embolism: Secondary | ICD-10-CM | POA: Diagnosis not present

## 2016-11-12 DIAGNOSIS — R202 Paresthesia of skin: Secondary | ICD-10-CM | POA: Diagnosis not present

## 2016-11-12 DIAGNOSIS — R0602 Shortness of breath: Secondary | ICD-10-CM

## 2016-11-12 MED ORDER — OMEPRAZOLE 40 MG PO CPDR: 40.0000 mg | DELAYED_RELEASE_CAPSULE | Freq: Every day | ORAL | 3 refills | Status: DC

## 2016-11-12 MED ORDER — GABAPENTIN 300 MG PO CAPS: 600.0000 mg | ORAL_CAPSULE | Freq: Two times a day (BID) | ORAL | 3 refills | Status: DC
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
## 2016-11-19 ENCOUNTER — Encounter: Payer: Self-pay | Admitting: Family Medicine

## 2016-11-19 ENCOUNTER — Ambulatory Visit (INDEPENDENT_AMBULATORY_CARE_PROVIDER_SITE_OTHER): Payer: 59 | Admitting: Family Medicine

## 2016-11-19 VITALS — BP 125/73 | HR 82 | Temp 97.8°F | Ht 79.0 in | Wt 279.0 lb

## 2016-11-19 DIAGNOSIS — M25561 Pain in right knee: Secondary | ICD-10-CM

## 2016-11-19 MED ORDER — PREDNISONE 20 MG PO TABS: 40.0000 mg | ORAL_TABLET | Freq: Every day | ORAL | 0 refills | Status: DC

## 2016-11-19 MED ORDER — METHYLPREDNISOLONE ACETATE 80 MG/ML IJ SUSP
80.0000 mg | Freq: Once | INTRAMUSCULAR | Status: AC
  Administered 2016-11-19: 80 mg via INTRAMUSCULAR

## 2016-11-19 NOTE — Patient Instructions (Signed)
Great to meet you!  Come back to see your PCP if your symptoms do not improve.

## 2016-11-19 NOTE — Progress Notes (Signed)
   HPI  Patient presents today here with right knee pain.  Patient states that he's had intermittent right knee pain almost his whole life. This all started after he hyperextended his knee after being kicked by horse when he was a teenager. He states that he woke up Thursday without injury with persistent right knee pain. He states that it's swollen and that he has had fluid removed from the multiple times with good effect.  He denies injury. He denies any history of gout. He denies fever, chills, sweats. He has also had previous pulmonary embolism that started with leg pain so he is anxious about his symptoms.  PMH: Smoking status noted ROS: Per HPI  Objective: BP 125/73   Pulse 82   Temp 97.8 F (36.6 C) (Oral)   Ht 6\' 7"  (2.007 m)   Wt 279 lb (126.6 kg)   BMI 31.43 kg/m  Gen: NAD, alert, cooperative with exam HEENT: NCAT, oropharynx moist and clear CV: RRR, good S1/S2, no murmur Resp: CTABL, no wheezes, non-labored Ext: No edema, calf circumference 46 cm symmetric bilaterally, no Tenderness Neuro: Alert and oriented, No gross deficits MSK: R knee without erythema, , bruising, or gross deformity Mild effusion on the right No joint line tenderness.  ligamentously intact to Lachman's and with varus and valgus stress.  Negative McMurray's test   Assessment and plan:  # Right knee pain Unclear etiology I suspect that he may have some injured meniscal tissue from his old injury that could be flaring up. Also consider gout Short course of steroids offered, patient opted to use IM Depo-Medrol. He did request having the knee drained, however his effusion is so mild that I believe it would be difficult target   Meds ordered this encounter  Medications  . DISCONTD: predniSONE (DELTASONE) 20 MG tablet    Sig: Take 2 tablets (40 mg total) by mouth daily with breakfast.    Dispense:  10 tablet    Refill:  0  . methylPREDNISolone acetate (DEPO-MEDROL) injection 80 mg     Laroy Apple, MD [facility] 11/19/2016, 7:20 PM

## 2016-11-20 ENCOUNTER — Ambulatory Visit: Payer: 59

## 2016-11-27 ENCOUNTER — Telehealth: Payer: Self-pay | Admitting: Family Medicine

## 2016-11-27 NOTE — Telephone Encounter (Signed)
Working on it. WS

## 2016-11-28 NOTE — Telephone Encounter (Signed)
Spoke to attorney office and let them know Dr Livia Snellen is working on the forms and they said [name] is going before review board on 10/18.

## 2016-11-29 ENCOUNTER — Encounter: Payer: Self-pay | Admitting: Nurse Practitioner

## 2016-11-29 ENCOUNTER — Ambulatory Visit (INDEPENDENT_AMBULATORY_CARE_PROVIDER_SITE_OTHER): Payer: 59

## 2016-11-29 ENCOUNTER — Ambulatory Visit (INDEPENDENT_AMBULATORY_CARE_PROVIDER_SITE_OTHER): Payer: 59 | Admitting: Nurse Practitioner

## 2016-11-29 VITALS — BP 112/74 | HR 61 | Temp 97.3°F | Ht 79.0 in | Wt 279.0 lb

## 2016-11-29 DIAGNOSIS — M25561 Pain in right knee: Secondary | ICD-10-CM

## 2016-11-29 MED ORDER — MELOXICAM 15 MG PO TABS: 15.0000 mg | ORAL_TABLET | Freq: Every day | ORAL | 3 refills | Status: DC

## 2016-11-29 NOTE — Patient Instructions (Signed)

## 2016-11-29 NOTE — Progress Notes (Signed)
   Subjective:    Patient ID: Kevin Beck, male    DOB: 07-19-1965, 51 y.o.   MRN: [id number]  HPI Patient in the office with c/o right knee pain since 11/20/16.  He was seen on 11/19/16 by Dr. Wendi Snipes for knee pain and was given 80 mg DepoMedrol IM injection.  He went to a ball game on Tuesday night, having neuropathy in bilateral feet, he slipped and his full weight fell on his right knee.  Patient states the pain is in the knee and radiates up the posterior thigh.  He is taking 600 mg ibuprofen as needed without relief.     Review of Systems  Respiratory: Negative for shortness of breath.   Cardiovascular: Negative for chest pain.  Musculoskeletal: Positive for arthralgias (right knee) and myalgias (posterior right thigh).  All other systems reviewed and are negative.      Objective:   Physical Exam  Constitutional: He is oriented to person, place, and time. He appears well-developed and well-nourished. No distress.  HENT:  Head: Normocephalic.  Cardiovascular: Normal rate, regular rhythm, normal heart sounds and intact distal pulses.   No murmur heard. Pulmonary/Chest: Effort normal and breath sounds normal. No respiratory distress. He has no wheezes.  Musculoskeletal: He exhibits no edema.       Right knee: He exhibits normal range of motion and no swelling. Tenderness (lateral knee) found.  Mild right knee effusion.  FROM of right knee with pain on full extension. Point tenderness posteriorly- no patella tenderness  Neurological: He is alert and oriented to person, place, and time.  Psychiatric: He has a normal mood and affect. His behavior is normal.   BP 112/74   Pulse 61   Temp (!) 97.3 F (36.3 C) (Oral)   Ht 6\' 7"  (2.007 m)   Wt 279 lb (126.6 kg)   BMI 31.43 kg/m     Assessment & Plan:   1. Acute pain of right knee    Elastic wrap Rest Ice BID Tylenol OTC Orders Placed This Encounter  Procedures  . DG Knee 1-2 Views Right    Standing Status:   Future    Number of Occurrences:   1    Standing Expiration Date:   01/29/2018    Order Specific Question:   Reason for Exam (SYMPTOM  OR DIAGNOSIS REQUIRED)    Answer:   knee pain    Order Specific Question:   Preferred imaging location?    Answer:   Internal  . Ambulatory referral to Orthopedic Surgery    Referral Priority:   Routine    Referral Type:   Surgical    Referral Reason:   Specialty Services Required    Requested Specialty:   Orthopedic Surgery    Number of Visits Requested:   [place], [place]

## 2016-12-07 DIAGNOSIS — M25561 Pain in right knee: Secondary | ICD-10-CM | POA: Diagnosis not present

## 2016-12-12 ENCOUNTER — Ambulatory Visit: Payer: 59 | Admitting: Family Medicine

## 2016-12-18 ENCOUNTER — Ambulatory Visit: Payer: 59 | Admitting: Family Medicine

## 2016-12-28 ENCOUNTER — Encounter: Payer: Self-pay | Admitting: Family Medicine

## 2016-12-28 ENCOUNTER — Ambulatory Visit (INDEPENDENT_AMBULATORY_CARE_PROVIDER_SITE_OTHER): Payer: 59 | Admitting: Family Medicine

## 2016-12-28 VITALS — BP 115/74 | HR 92 | Ht 79.0 in | Wt 281.0 lb

## 2016-12-28 DIAGNOSIS — G549 Nerve root and plexus disorder, unspecified: Secondary | ICD-10-CM

## 2016-12-28 DIAGNOSIS — M5416 Radiculopathy, lumbar region: Secondary | ICD-10-CM | POA: Diagnosis not present

## 2016-12-28 DIAGNOSIS — F418 Other specified anxiety disorders: Secondary | ICD-10-CM | POA: Diagnosis not present

## 2016-12-28 DIAGNOSIS — M5412 Radiculopathy, cervical region: Secondary | ICD-10-CM | POA: Diagnosis not present

## 2016-12-28 MED ORDER — DULOXETINE HCL 60 MG PO CPEP: 60.0000 mg | ORAL_CAPSULE | Freq: Two times a day (BID) | ORAL | 2 refills | Status: DC

## 2016-12-28 MED ORDER — GABAPENTIN 300 MG PO CAPS: 600.0000 mg | ORAL_CAPSULE | Freq: Two times a day (BID) | ORAL | 2 refills | Status: DC
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
## 2017-01-18 ENCOUNTER — Encounter: Payer: Self-pay | Admitting: Family Medicine

## 2017-01-18 ENCOUNTER — Ambulatory Visit (INDEPENDENT_AMBULATORY_CARE_PROVIDER_SITE_OTHER): Payer: 59 | Admitting: Family Medicine

## 2017-01-18 VITALS — BP 129/76 | HR 79 | Temp 98.2°F | Ht 79.0 in | Wt 285.0 lb

## 2017-01-18 DIAGNOSIS — Z7901 Long term (current) use of anticoagulants: Secondary | ICD-10-CM | POA: Diagnosis not present

## 2017-01-18 DIAGNOSIS — M5412 Radiculopathy, cervical region: Secondary | ICD-10-CM | POA: Diagnosis not present

## 2017-01-18 DIAGNOSIS — F418 Other specified anxiety disorders: Secondary | ICD-10-CM

## 2017-01-18 DIAGNOSIS — G549 Nerve root and plexus disorder, unspecified: Secondary | ICD-10-CM

## 2017-01-18 DIAGNOSIS — M5416 Radiculopathy, lumbar region: Secondary | ICD-10-CM | POA: Diagnosis not present

## 2017-01-18 MED ORDER — GABAPENTIN 300 MG PO CAPS: 1800.0000 mg | ORAL_CAPSULE | Freq: Two times a day (BID) | ORAL | 5 refills | Status: DC

## 2017-01-18 MED ORDER — OMEPRAZOLE 40 MG PO CPDR: 40.0000 mg | DELAYED_RELEASE_CAPSULE | Freq: Every day | ORAL | 5 refills | Status: DC

## 2017-01-18 MED ORDER — SILDENAFIL CITRATE 20 MG PO TABS: ORAL_TABLET | ORAL | 5 refills | Status: DC

## 2017-01-18 MED ORDER — DULOXETINE HCL 60 MG PO CPEP: 60.0000 mg | ORAL_CAPSULE | Freq: Two times a day (BID) | ORAL | 5 refills | Status: DC
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
## 2017-01-30 ENCOUNTER — Ambulatory Visit: Payer: 59 | Admitting: Family Medicine

## 2017-02-08 ENCOUNTER — Encounter: Payer: Self-pay | Admitting: Family

## 2017-02-08 ENCOUNTER — Ambulatory Visit (INDEPENDENT_AMBULATORY_CARE_PROVIDER_SITE_OTHER): Payer: 59

## 2017-02-08 ENCOUNTER — Ambulatory Visit (INDEPENDENT_AMBULATORY_CARE_PROVIDER_SITE_OTHER): Payer: 59 | Admitting: Family

## 2017-02-08 VITALS — BP 139/88 | HR 72 | Temp 97.2°F | Ht 79.0 in | Wt 289.8 lb

## 2017-02-08 DIAGNOSIS — M5442 Lumbago with sciatica, left side: Secondary | ICD-10-CM

## 2017-02-08 DIAGNOSIS — S3992XA Unspecified injury of lower back, initial encounter: Secondary | ICD-10-CM | POA: Diagnosis not present

## 2017-02-08 MED ORDER — CYCLOBENZAPRINE HCL 10 MG PO TABS: 10.0000 mg | ORAL_TABLET | Freq: Three times a day (TID) | ORAL | 0 refills | Status: DC | PRN

## 2017-02-08 MED ORDER — METHYLPREDNISOLONE ACETATE 80 MG/ML IJ SUSP
80.0000 mg | Freq: Once | INTRAMUSCULAR | Status: AC
  Administered 2017-02-08: 80 mg via INTRAMUSCULAR

## 2017-02-08 MED ORDER — KETOROLAC TROMETHAMINE 60 MG/2ML IM SOLN
60.0000 mg | Freq: Once | INTRAMUSCULAR | Status: AC
  Administered 2017-02-08: 60 mg via INTRAMUSCULAR

## 2017-02-08 NOTE — Patient Instructions (Signed)

## 2017-02-08 NOTE — Progress Notes (Signed)
   Subjective:    Patient ID: Kevin Beck, male    DOB: 05/07/1965, 51 y.o.   MRN: [id number]  Back Pain  This is a new problem. The current episode started yesterday. The problem occurs constantly. The problem is unchanged. The pain is present in the lumbar spine. The quality of the pain is described as aching. The pain radiates to the left thigh. The pain is at a severity of 10/10. The pain is moderate. The symptoms are aggravated by lying down, standing and twisting. Associated symptoms include leg pain, numbness, tingling and weakness. Pertinent negatives include no bladder incontinence or bowel incontinence. Risk factors include obesity. He has tried bed rest and NSAIDs for the symptoms. The treatment provided mild relief.      Review of Systems  Gastrointestinal: Negative for bowel incontinence.  Genitourinary: Negative for bladder incontinence.  Musculoskeletal: Positive for back pain.  Neurological: Positive for tingling, weakness and numbness.  All other systems reviewed and are negative.      Objective:   Physical Exam  Constitutional: He is oriented to person, place, and time. He appears well-developed and well-nourished. No distress.  HENT:  Head: Normocephalic.  Eyes: Pupils are equal, round, and reactive to light. Right eye exhibits no discharge. Left eye exhibits no discharge.  Neck: Normal range of motion. Neck supple. No thyromegaly present.  Cardiovascular: Normal rate, regular rhythm, normal heart sounds and intact distal pulses.  No murmur heard. Pulmonary/Chest: Effort normal and breath sounds normal. No respiratory distress. He has no wheezes.  Abdominal: Soft. Bowel sounds are normal. He exhibits no distension. There is no tenderness.  Musculoskeletal: He exhibits tenderness (in lower lumbar with flexion and extension, unable to do SLR because of pain). He exhibits no edema.  Neurological: He is alert and oriented to person, place, and time.  Skin: Skin is  warm and dry. No rash noted. No erythema.  Psychiatric: He has a normal mood and affect. His behavior is normal. Judgment and thought content normal.  Vitals reviewed.  Lumbar x-ray- Negative Preliminary reading by Evelina Dun, FNP [facility]   BP 139/88   Pulse 72   Temp (!) 97.2 F (36.2 C) (Oral)   Ht 6\' 7"  (2.007 m)   Wt 289 lb 12.8 oz (131.5 kg)   BMI 32.65 kg/m      Assessment & Plan:  1. Acute bilateral low back pain with left-sided sciatica Rest Ice  ROM exercises Keep follow up appt with PCP - DG Lumbar Spine 2-3 Views; Future - methylPREDNISolone acetate (DEPO-MEDROL) injection 80 mg - ketorolac (TORADOL) injection 60 mg - cyclobenzaprine (FLEXERIL) 10 MG tablet; Take 1 tablet (10 mg total) by mouth 3 (three) times daily as needed for muscle spasms.  Dispense: 30 tablet; Refill: 0  Evelina Dun, FNP

## 2017-03-19 ENCOUNTER — Ambulatory Visit (INDEPENDENT_AMBULATORY_CARE_PROVIDER_SITE_OTHER): Payer: Medicaid Other | Admitting: Family Medicine

## 2017-03-19 ENCOUNTER — Encounter: Payer: Self-pay | Admitting: Family Medicine

## 2017-03-19 VITALS — BP 115/75 | HR 82 | Temp 97.2°F | Ht 79.0 in | Wt 283.0 lb

## 2017-03-19 DIAGNOSIS — F418 Other specified anxiety disorders: Secondary | ICD-10-CM

## 2017-03-19 DIAGNOSIS — M5416 Radiculopathy, lumbar region: Secondary | ICD-10-CM

## 2017-03-19 DIAGNOSIS — Z7901 Long term (current) use of anticoagulants: Secondary | ICD-10-CM | POA: Diagnosis not present

## 2017-03-19 DIAGNOSIS — M5442 Lumbago with sciatica, left side: Secondary | ICD-10-CM

## 2017-03-19 MED ORDER — CYCLOBENZAPRINE HCL 10 MG PO TABS: 10.0000 mg | ORAL_TABLET | Freq: Three times a day (TID) | ORAL | 2 refills | Status: DC | PRN

## 2017-03-19 MED ORDER — RIVAROXABAN 10 MG PO TABS: 10.0000 mg | ORAL_TABLET | Freq: Every day | ORAL | 5 refills | Status: DC
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
## 2017-06-17 ENCOUNTER — Ambulatory Visit: Payer: Self-pay | Admitting: Family Medicine

## 2017-06-18 ENCOUNTER — Encounter: Payer: Self-pay | Admitting: Family Medicine

## 2017-07-02 ENCOUNTER — Encounter: Payer: Self-pay | Admitting: Family Medicine

## 2017-07-02 ENCOUNTER — Ambulatory Visit: Payer: PRIVATE HEALTH INSURANCE | Admitting: Family Medicine

## 2017-07-02 VITALS — BP 115/80 | HR 81 | Temp 97.2°F | Ht 79.0 in | Wt 280.5 lb

## 2017-07-02 DIAGNOSIS — R202 Paresthesia of skin: Secondary | ICD-10-CM

## 2017-07-02 DIAGNOSIS — M5416 Radiculopathy, lumbar region: Secondary | ICD-10-CM

## 2017-07-02 DIAGNOSIS — M5412 Radiculopathy, cervical region: Secondary | ICD-10-CM | POA: Diagnosis not present

## 2017-07-02 DIAGNOSIS — R0602 Shortness of breath: Secondary | ICD-10-CM

## 2017-07-02 DIAGNOSIS — F418 Other specified anxiety disorders: Secondary | ICD-10-CM

## 2017-07-02 DIAGNOSIS — Z13220 Encounter for screening for lipoid disorders: Secondary | ICD-10-CM

## 2017-07-02 DIAGNOSIS — R350 Frequency of micturition: Secondary | ICD-10-CM

## 2017-07-02 DIAGNOSIS — Z7901 Long term (current) use of anticoagulants: Secondary | ICD-10-CM

## 2017-07-02 LAB — URINALYSIS
BILIRUBIN UA: NEGATIVE
GLUCOSE, UA: NEGATIVE
KETONES UA: NEGATIVE
Leukocytes, UA: NEGATIVE
NITRITE UA: NEGATIVE
Protein, UA: NEGATIVE
RBC UA: NEGATIVE
Specific Gravity, UA: >1.03 — ABNORMAL HIGH (ref 1.005–1.030)
UUROB: 0.2 mg/dL (ref 0.2–1.0)
pH, UA: 5 (ref 5.0–7.5)

## 2017-07-02 MED ORDER — CIPROFLOXACIN HCL 500 MG PO TABS: 500.0000 mg | ORAL_TABLET | Freq: Two times a day (BID) | ORAL | 0 refills | Status: DC
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
## 2017-07-03 LAB — CMP14+EGFR
ALK PHOS: 106 IU/L (ref 39–117)
ALT: 25 IU/L (ref 0–44)
AST: 19 IU/L (ref 0–40)
Albumin/Globulin Ratio: 1.5 (ref 1.2–2.2)
Albumin: 4.2 g/dL (ref 3.5–5.5)
BUN/Creatinine Ratio: 14 (ref 9–20)
BUN: 13 mg/dL (ref 6–24)
Bilirubin Total: 0.4 mg/dL (ref 0.0–1.2)
CALCIUM: 9.1 mg/dL (ref 8.7–10.2)
CO2: 21 mmol/L (ref 20–29)
CREATININE: 0.91 mg/dL (ref 0.76–1.27)
Chloride: 105 mmol/L (ref 96–106)
GFR calc Af Amer: 114 mL/min/{1.73_m2} (ref >59)
GFR, EST NON AFRICAN AMERICAN: 99 mL/min/{1.73_m2} (ref >59)
GLUCOSE: 109 mg/dL — AB (ref 65–99)
Globulin, Total: 2.8 g/dL (ref 1.5–4.5)
Potassium: 4.3 mmol/L (ref 3.5–5.2)
Sodium: 139 mmol/L (ref 134–144)
TOTAL PROTEIN: 7 g/dL (ref 6.0–8.5)

## 2017-07-03 LAB — URINE CULTURE: Organism ID, Bacteria: NO GROWTH

## 2017-07-03 LAB — CBC WITH DIFFERENTIAL/PLATELET
Basophils Absolute: 0.1 10*3/uL (ref 0.0–0.2)
Basos: 1 %
EOS (ABSOLUTE): 0.2 10*3/uL (ref 0.0–0.4)
EOS: 3 %
HEMOGLOBIN: 17.9 g/dL — AB (ref 13.0–17.7)
Hematocrit: 50.5 % (ref 37.5–51.0)
IMMATURE GRANS (ABS): 0 10*3/uL (ref 0.0–0.1)
IMMATURE GRANULOCYTES: 0 %
LYMPHS: 29 %
Lymphocytes Absolute: 1.6 10*3/uL (ref 0.7–3.1)
MCH: 31.4 pg (ref 26.6–33.0)
MCHC: 35.4 g/dL (ref 31.5–35.7)
MCV: 89 fL (ref 79–97)
MONOCYTES: 6 %
Monocytes Absolute: 0.3 10*3/uL (ref 0.1–0.9)
Neutrophils Absolute: 3.5 10*3/uL (ref 1.4–7.0)
Neutrophils: 61 %
Platelets: 166 10*3/uL (ref 150–379)
RBC: 5.7 x10E6/uL (ref 4.14–5.80)
RDW: 13.5 % (ref 12.3–15.4)
WBC: 5.7 10*3/uL (ref 3.4–10.8)

## 2017-07-03 LAB — LIPID PANEL
CHOL/HDL RATIO: 4.2 ratio (ref 0.0–5.0)
CHOLESTEROL TOTAL: 179 mg/dL (ref 100–199)
HDL: 43 mg/dL (ref >39)
LDL CALC: 114 mg/dL — AB (ref 0–99)
TRIGLYCERIDES: 111 mg/dL (ref 0–149)
VLDL Cholesterol Cal: 22 mg/dL (ref 5–40)

## 2017-07-03 LAB — VITAMIN B12: VITAMIN B 12: 594 pg/mL (ref 232–1245)

## 2017-07-03 LAB — TSH: TSH: 1.14 u[IU]/mL (ref 0.450–4.500)

## 2017-10-04 IMAGING — CT CT ANGIO CHEST
2 of 6 series · 18 of 36 positions shown · IV contrast (Omni 300)
Comparison: Prior radiograph from 11/03/2015.

CLINICAL DATA: Initial evaluation for acute left-sided chest pain.
History of prior PE.

EXAM:
CT ANGIOGRAPHY CHEST WITH CONTRAST
TECHNIQUE: Multidetector CT imaging of the chest was performed using the
standard protocol during bolus administration of intravenous
contrast. Multiplanar CT image reconstructions and MIPs were
obtained to evaluate the vascular anatomy.
CONTRAST:  73 cc of Isovue 370.

[Series 6: pe thins · axial · 0.73mm/px · z∈[-351,-77]mm · 17 of 310 slices shown]
[im 18/310  lung]
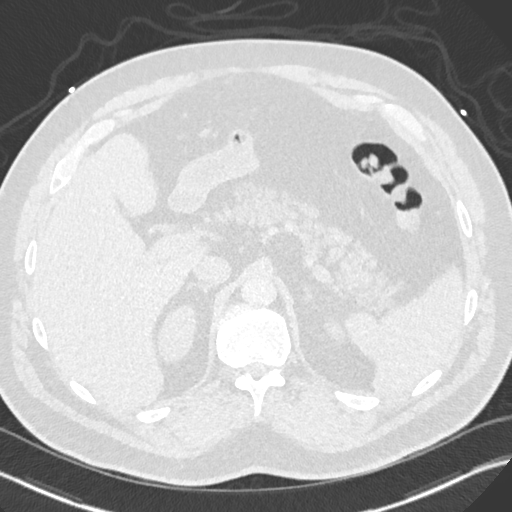
[im 35/310  mediastinal]
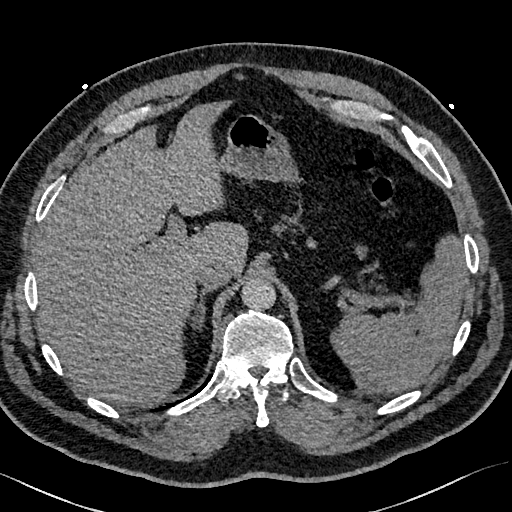
[im 52/310  lung]
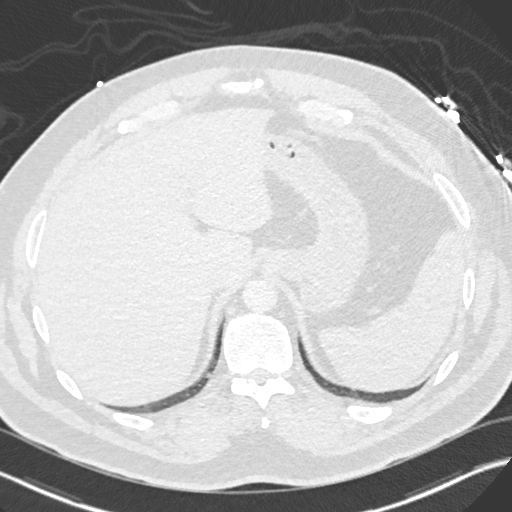
[im 69/310  mediastinal]
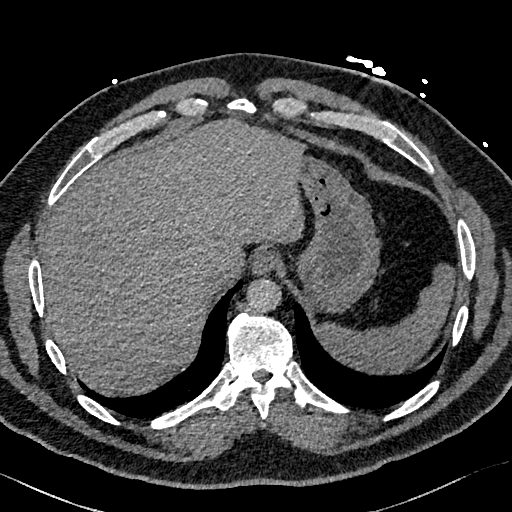
[im 86/310  lung]
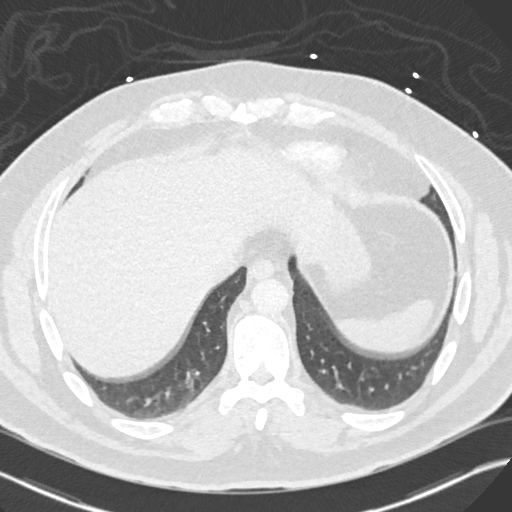
[im 104/310  mediastinal]
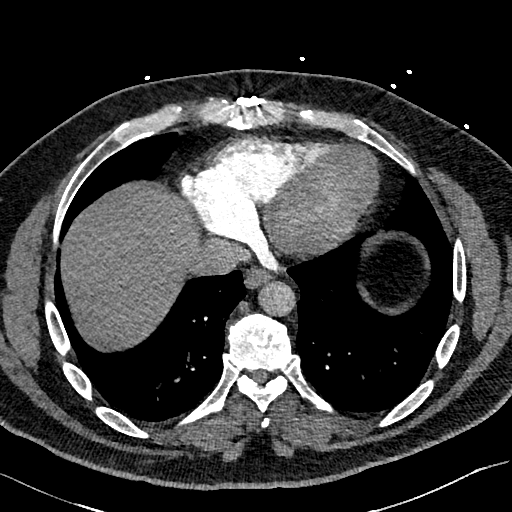
[im 121/310  lung]
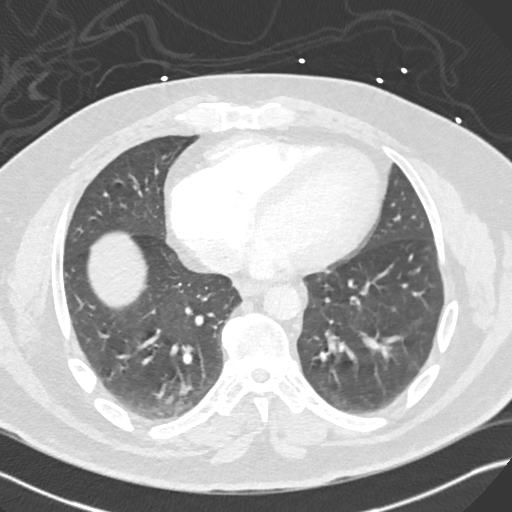
[im 138/310  mediastinal]
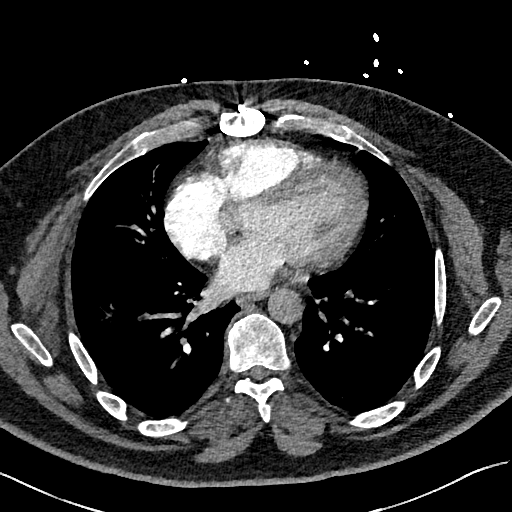
[im 155/310  lung]
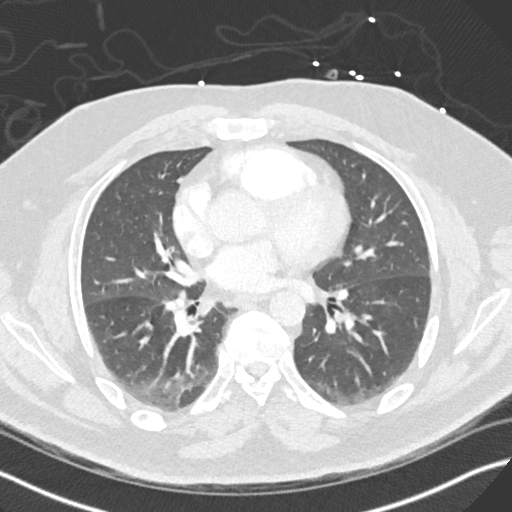
[im 172/310  mediastinal]
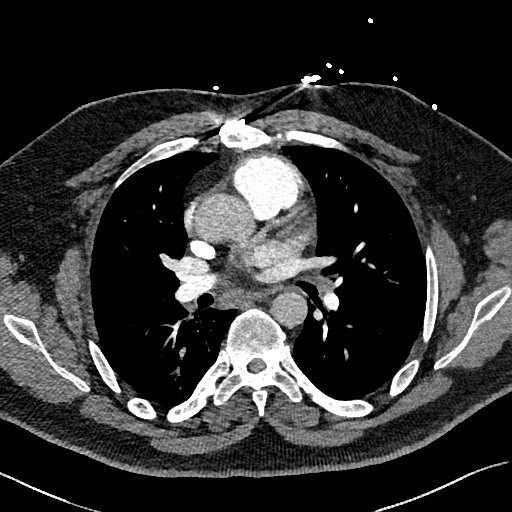
[im 189/310  lung]
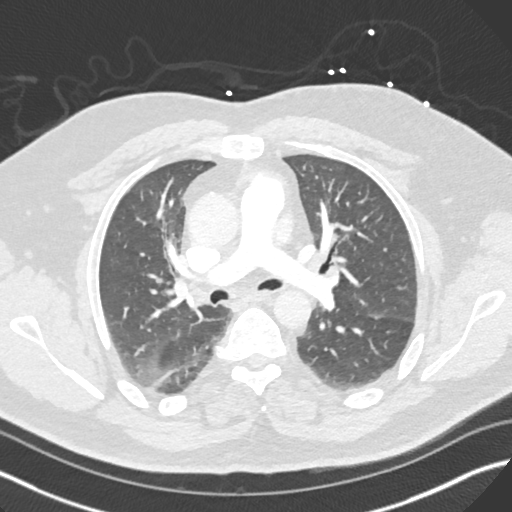
[im 207/310  mediastinal]
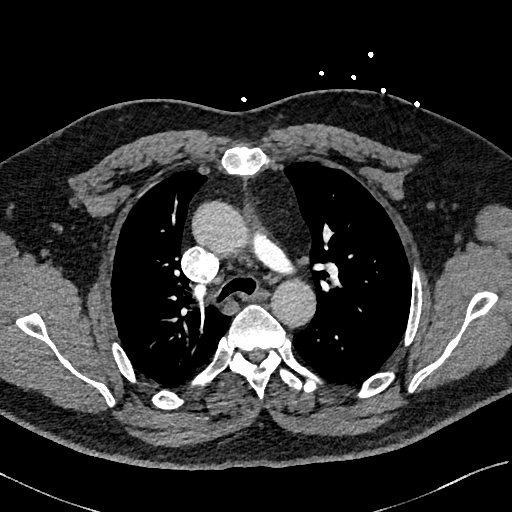
[im 224/310  lung]
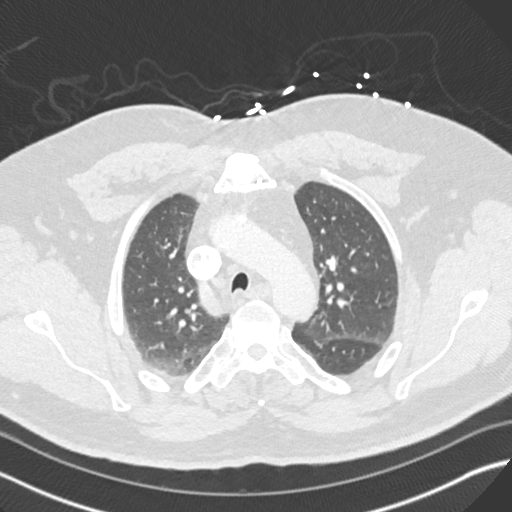
[im 241/310  mediastinal]
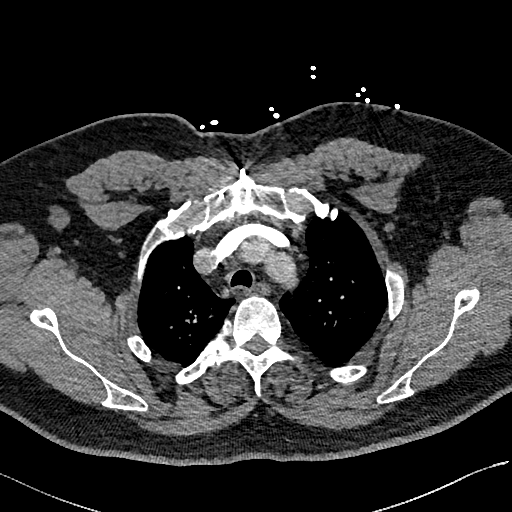
[im 258/310  lung]
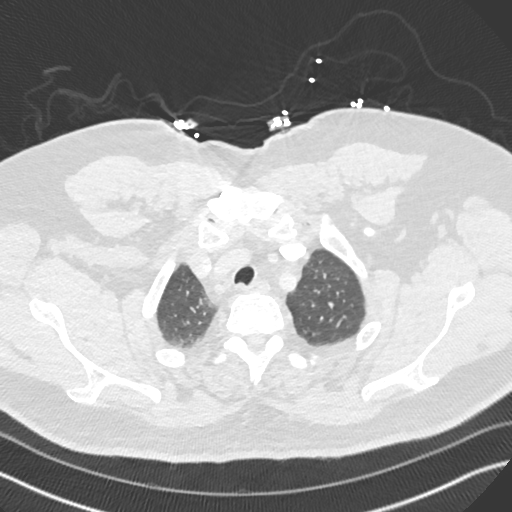
[im 275/310  mediastinal]
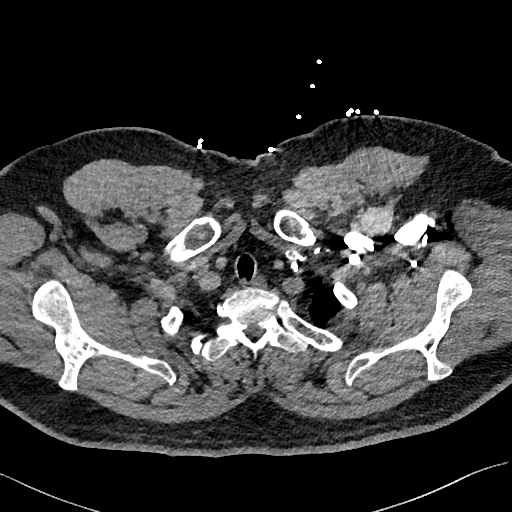
[im 292/310  lung]
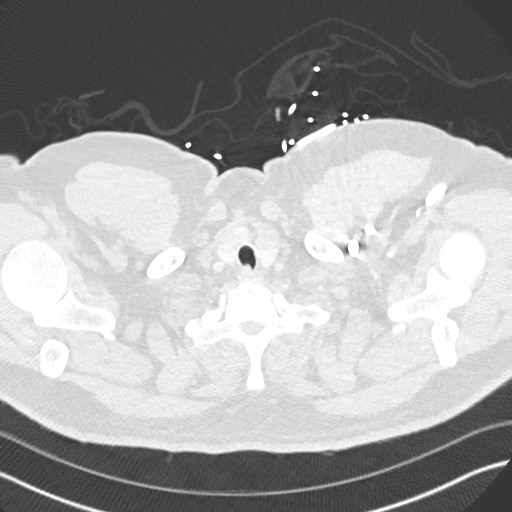

[Series 7: pe 2mm cor · coronal · 0.60mm/px · 1 of 148 slices shown]
[im 74/148  mediastinal]
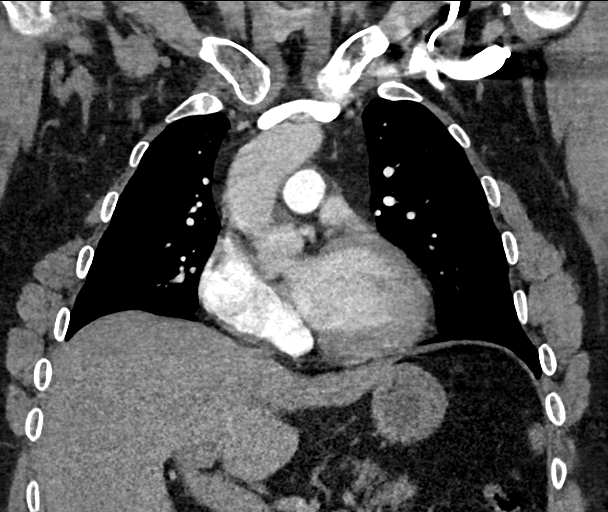

[18 of 36 positions shown; findings below may reference images not displayed]

FINDINGS: Cardiovascular: Intrathoracic aorta of normal caliber without acute
abnormality. Partially visualized great vessels within normal
limits. Heart size normal. No pericardial effusion.

Pulmonary arteries adequately opacified for evaluation. Main
pulmonary artery measures within normal limits for size at 2.5 cm.
Please note that evaluation for possible small distal emboli
somewhat limited by motion artifact. No filling defect to suggest
acute pulmonary embolus identified. Re-formatted imaging confirms
these findings.

Mediastinum/Nodes: The thyroid gland within normal limits. No
pathologically enlarged mediastinal, hilar, or axillary lymph nodes
identified. Esophagus within normal limits.

Lungs/Pleura: Tracheobronchial tree is patent. Deep tendon
atelectasis within the lungs bilaterally, right greater than left.
No focal infiltrate, pulmonary edema, or pleural effusion. 5 mm
nodular density along the right major fissure favored to reflect an
intra pulmonic lymph node. Single 4 mm right upper lobe nodule
present (series 5, image 38). No other worrisome pulmonary nodule or
mass. No pneumothorax.

Upper Abdomen: Visualized portions of the upper abdomen are
unremarkable.

Musculoskeletal: No acute osseous abnormality. No worrisome lytic or
blastic osseous lesions. Median sternotomy wires noted. Cervical
ACDF partially visualized.

Review of the MIP images confirms the above findings.
IMPRESSION: 1. No CT evidence for acute pulmonary embolism identified.
2. No other active cardiopulmonary disease identified.
3. Mild deep tendon atelectatic changes within the lungs
bilaterally, right greater than left.
4. 4 mm right upper lobe pulmonary nodule, indeterminate the. **An
incidental finding of potential clinical significance has been
found. No follow-up needed if patient is low-risk. Non-contrast
chest CT can be considered in 12 months if patient is high-risk.
This recommendation follows the consensus statement: Guidelines for
Management of Incidental Pulmonary Nodules Detected on CT Images:

## 2018-06-05 ENCOUNTER — Other Ambulatory Visit: Payer: Self-pay | Admitting: Family Medicine

## 2019-03-13 ENCOUNTER — Other Ambulatory Visit: Payer: Self-pay

## 2019-03-13 ENCOUNTER — Ambulatory Visit: Payer: PRIVATE HEALTH INSURANCE | Attending: Internal Medicine

## 2019-03-13 DIAGNOSIS — Z20822 Contact with and (suspected) exposure to covid-19: Secondary | ICD-10-CM

## 2019-03-14 LAB — NOVEL CORONAVIRUS, NAA: SARS-CoV-2, NAA: NOT DETECTED

## 2019-03-16 ENCOUNTER — Telehealth: Payer: Self-pay

## 2019-03-16 NOTE — Telephone Encounter (Signed)
Pt. Given COVID 19 results, verbalizes understanding. 

## 2019-06-11 ENCOUNTER — Encounter: Payer: Self-pay | Admitting: Family Medicine

## 2019-06-11 ENCOUNTER — Ambulatory Visit (INDEPENDENT_AMBULATORY_CARE_PROVIDER_SITE_OTHER): Payer: PRIVATE HEALTH INSURANCE

## 2019-06-11 ENCOUNTER — Ambulatory Visit: Payer: PRIVATE HEALTH INSURANCE | Admitting: Family Medicine

## 2019-06-11 ENCOUNTER — Other Ambulatory Visit: Payer: Self-pay

## 2019-06-11 VITALS — BP 130/76 | HR 75 | Temp 97.9°F | Resp 20 | Ht 79.0 in | Wt 309.2 lb

## 2019-06-11 DIAGNOSIS — M5412 Radiculopathy, cervical region: Secondary | ICD-10-CM | POA: Diagnosis not present

## 2019-06-11 DIAGNOSIS — Z125 Encounter for screening for malignant neoplasm of prostate: Secondary | ICD-10-CM

## 2019-06-11 DIAGNOSIS — Z7901 Long term (current) use of anticoagulants: Secondary | ICD-10-CM | POA: Diagnosis not present

## 2019-06-11 DIAGNOSIS — J984 Other disorders of lung: Secondary | ICD-10-CM

## 2019-06-11 DIAGNOSIS — E559 Vitamin D deficiency, unspecified: Secondary | ICD-10-CM

## 2019-06-11 DIAGNOSIS — G4701 Insomnia due to medical condition: Secondary | ICD-10-CM

## 2019-06-11 DIAGNOSIS — R0602 Shortness of breath: Secondary | ICD-10-CM

## 2019-06-11 DIAGNOSIS — I739 Peripheral vascular disease, unspecified: Secondary | ICD-10-CM

## 2019-06-11 DIAGNOSIS — R072 Precordial pain: Secondary | ICD-10-CM

## 2019-06-11 DIAGNOSIS — Z1231 Encounter for screening mammogram for malignant neoplasm of breast: Secondary | ICD-10-CM

## 2019-06-11 DIAGNOSIS — M5416 Radiculopathy, lumbar region: Secondary | ICD-10-CM | POA: Diagnosis not present

## 2019-06-11 DIAGNOSIS — I513 Intracardiac thrombosis, not elsewhere classified: Secondary | ICD-10-CM

## 2019-06-11 MED ORDER — RIVAROXABAN (XARELTO) VTE STARTER PACK (15 & 20 MG): ORAL_TABLET | ORAL | 0 refills | Status: DC

## 2019-06-11 MED ORDER — TRAZODONE HCL 150 MG PO TABS: ORAL_TABLET | ORAL | 5 refills | Status: DC

## 2019-06-11 MED ORDER — RIVAROXABAN 20 MG PO TABS: 20.0000 mg | ORAL_TABLET | Freq: Every day | ORAL | 2 refills | Status: DC

## 2019-06-11 MED ORDER — BREO ELLIPTA 200-25 MCG/INH IN AEPB: 1.0000 | INHALATION_SPRAY | Freq: Every day | RESPIRATORY_TRACT | 11 refills | Status: DC
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
## 2019-06-12 ENCOUNTER — Telehealth: Payer: Self-pay | Admitting: Family Medicine

## 2019-06-12 LAB — LIPID PANEL
Chol/HDL Ratio: 4.9 ratio (ref 0.0–5.0)
Cholesterol, Total: 185 mg/dL (ref 100–199)
HDL: 38 mg/dL — ABNORMAL LOW (ref >39)
LDL Chol Calc (NIH): 109 mg/dL — ABNORMAL HIGH (ref 0–99)
Triglycerides: 219 mg/dL — ABNORMAL HIGH (ref 0–149)
VLDL Cholesterol Cal: 38 mg/dL (ref 5–40)

## 2019-06-12 LAB — CBC WITH DIFFERENTIAL/PLATELET
Basophils Absolute: 0.1 10*3/uL (ref 0.0–0.2)
Basos: 1 %
EOS (ABSOLUTE): 0.2 10*3/uL (ref 0.0–0.4)
Eos: 3 %
Hematocrit: 49.7 % (ref 37.5–51.0)
Hemoglobin: 16.6 g/dL (ref 13.0–17.7)
Immature Grans (Abs): 0 10*3/uL (ref 0.0–0.1)
Immature Granulocytes: 0 %
Lymphocytes Absolute: 1.7 10*3/uL (ref 0.7–3.1)
Lymphs: 24 %
MCH: 30.7 pg (ref 26.6–33.0)
MCHC: 33.4 g/dL (ref 31.5–35.7)
MCV: 92 fL (ref 79–97)
Monocytes Absolute: 0.5 10*3/uL (ref 0.1–0.9)
Monocytes: 8 %
Neutrophils Absolute: 4.5 10*3/uL (ref 1.4–7.0)
Neutrophils: 64 %
Platelets: 177 10*3/uL (ref 150–450)
RBC: 5.4 x10E6/uL (ref 4.14–5.80)
RDW: 12.9 % (ref 11.6–15.4)
WBC: 7 10*3/uL (ref 3.4–10.8)

## 2019-06-12 LAB — CMP14+EGFR
ALT: 28 IU/L (ref 0–44)
AST: 18 IU/L (ref 0–40)
Albumin/Globulin Ratio: 1.6 (ref 1.2–2.2)
Albumin: 4.2 g/dL (ref 3.8–4.9)
Alkaline Phosphatase: 141 IU/L — ABNORMAL HIGH (ref 39–117)
BUN/Creatinine Ratio: 13 (ref 9–20)
BUN: 13 mg/dL (ref 6–24)
Bilirubin Total: 0.3 mg/dL (ref 0.0–1.2)
CO2: 25 mmol/L (ref 20–29)
Calcium: 9.2 mg/dL (ref 8.7–10.2)
Chloride: 103 mmol/L (ref 96–106)
Creatinine, Ser: 1.03 mg/dL (ref 0.76–1.27)
GFR calc Af Amer: 97 mL/min/{1.73_m2} (ref >59)
GFR calc non Af Amer: 84 mL/min/{1.73_m2} (ref >59)
Globulin, Total: 2.6 g/dL (ref 1.5–4.5)
Glucose: 98 mg/dL (ref 65–99)
Potassium: 4.2 mmol/L (ref 3.5–5.2)
Sodium: 143 mmol/L (ref 134–144)
Total Protein: 6.8 g/dL (ref 6.0–8.5)

## 2019-06-12 LAB — VITAMIN D 25 HYDROXY (VIT D DEFICIENCY, FRACTURES): Vit D, 25-Hydroxy: 21 ng/mL — ABNORMAL LOW (ref 30.0–100.0)

## 2019-06-12 LAB — TSH+FREE T4
Free T4: 1.38 ng/dL (ref 0.82–1.77)
TSH: 0.558 u[IU]/mL (ref 0.450–4.500)

## 2019-06-12 LAB — PSA TOTAL (REFLEX TO FREE): Prostate Specific Ag, Serum: 0.5 ng/mL (ref 0.0–4.0)

## 2019-07-08 ENCOUNTER — Ambulatory Visit: Payer: PRIVATE HEALTH INSURANCE | Admitting: Family Medicine

## 2019-07-08 ENCOUNTER — Other Ambulatory Visit: Payer: Self-pay

## 2019-07-08 ENCOUNTER — Encounter: Payer: Self-pay | Admitting: Family Medicine

## 2019-07-08 VITALS — BP 126/75 | HR 79 | Temp 97.9°F | Resp 20 | Ht 79.0 in | Wt 302.5 lb

## 2019-07-08 DIAGNOSIS — M5412 Radiculopathy, cervical region: Secondary | ICD-10-CM

## 2019-07-08 DIAGNOSIS — M5416 Radiculopathy, lumbar region: Secondary | ICD-10-CM

## 2019-07-08 DIAGNOSIS — R05 Cough: Secondary | ICD-10-CM

## 2019-07-08 DIAGNOSIS — R059 Cough, unspecified: Secondary | ICD-10-CM

## 2019-07-08 DIAGNOSIS — J984 Other disorders of lung: Secondary | ICD-10-CM

## 2019-07-08 DIAGNOSIS — Z7901 Long term (current) use of anticoagulants: Secondary | ICD-10-CM

## 2019-07-08 MED ORDER — NAPROXEN 500 MG PO TABS: 500.0000 mg | ORAL_TABLET | Freq: Two times a day (BID) | ORAL | 1 refills | Status: DC
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
## 2020-01-22 ENCOUNTER — Telehealth: Payer: PRIVATE HEALTH INSURANCE | Admitting: Physician Assistant

## 2020-01-22 DIAGNOSIS — J029 Acute pharyngitis, unspecified: Secondary | ICD-10-CM | POA: Diagnosis not present

## 2020-01-22 MED ORDER — CLINDAMYCIN HCL 150 MG PO CAPS: 300.0000 mg | ORAL_CAPSULE | Freq: Three times a day (TID) | ORAL | 0 refills | Status: DC

## 2020-01-22 NOTE — Progress Notes (Signed)
We are sorry that you are not feeling well.  Here is how we plan to help!  Based on what you have shared with me it is likely that you have strep pharyngitis.  Strep pharyngitis is inflammation and infection in the back of the throat.  This is an infection cause by bacteria and is treated with antibiotics.  I have prescribed Clindamycin 300 mg three times a day for 10 days. For throat pain, we recommend over the counter oral pain relief medications such as acetaminophen or aspirin, or anti-inflammatory medications such as ibuprofen or naproxen sodium. Topical treatments such as oral throat lozenges or sprays may be used as needed. Strep infections are not as easily transmitted as other respiratory infections, however we still recommend that you avoid close contact with loved ones, especially the very young and elderly.  Remember to wash your hands thoroughly throughout the day as this is the number one way to prevent the spread of infection and wipe down door knobs and counters with disinfectant.   Home Care:  Only take medications as instructed by your medical team.  Complete the entire course of an antibiotic.  Do not take these medications with alcohol.  A steam or ultrasonic humidifier can help congestion.  You can place a towel over your head and breathe in the steam from hot water coming from a faucet.  Avoid close contacts especially the very young and the elderly.  Cover your mouth when you cough or sneeze.  Always remember to wash your hands.  Get Help Right Away If:  You develop worsening fever or sinus pain.  You develop a severe head ache or visual changes.  Your symptoms persist after you have completed your treatment plan.  Make sure you  Understand these instructions.  Will watch your condition.  Will get help right away if you are not doing well or get worse.  Your e-visit answers were reviewed by a board certified advanced clinical practitioner to complete your  personal care plan.  Depending on the condition, your plan could have included both over the counter or prescription medications.  If there is a problem please reply  once you have received a response from your provider.  Your safety is important to [place].  If you have drug allergies check your prescription carefully.    You can use MyChart to ask questions about today's visit, request a non-urgent call back, or ask for a work or school excuse for 24 hours related to this e-Visit. If it has been greater than 24 hours you will need to follow up with your provider, or enter a new e-Visit to address those concerns.  You will get an e-mail in the next two days asking about your experience.  I hope that your e-visit has been valuable and will speed your recovery. Thank you for using e-visits.  Greater than 5 minutes, yet less than 10 minutes of time have been spent researching, coordinating, and implementing care for this patient today

## 2021-02-27 ENCOUNTER — Ambulatory Visit (INDEPENDENT_AMBULATORY_CARE_PROVIDER_SITE_OTHER): Payer: No Typology Code available for payment source | Admitting: Nurse Practitioner

## 2021-02-27 ENCOUNTER — Encounter: Payer: Self-pay | Admitting: Nurse Practitioner

## 2021-02-27 ENCOUNTER — Ambulatory Visit (INDEPENDENT_AMBULATORY_CARE_PROVIDER_SITE_OTHER): Payer: No Typology Code available for payment source

## 2021-02-27 VITALS — BP 134/77 | HR 72 | Temp 97.7°F | Resp 20 | Ht 79.0 in | Wt 306.0 lb

## 2021-02-27 DIAGNOSIS — L409 Psoriasis, unspecified: Secondary | ICD-10-CM | POA: Diagnosis not present

## 2021-02-27 DIAGNOSIS — U099 Post covid-19 condition, unspecified: Secondary | ICD-10-CM | POA: Diagnosis not present

## 2021-02-27 MED ORDER — TRIAMCINOLONE ACETONIDE 0.1 % EX CREA: 1.0000 "application " | TOPICAL_CREAM | Freq: Two times a day (BID) | CUTANEOUS | 1 refills | Status: DC

## 2021-02-27 MED ORDER — PREDNISONE 10 MG (21) PO TBPK: ORAL_TABLET | ORAL | 0 refills | Status: DC

## 2021-02-27 NOTE — Progress Notes (Signed)
° °  Subjective:    Patient ID: Kevin Beck, male    DOB: 01-10-1966, 56 y.o.   MRN: [id number]   Chief Complaint: Blister on left foot and Rash on both arms   HPI Patient comes in today with 2 complaints: - Patient was wearing some boots while hunting and rubbed a blister on his foot,. Now he has flaky patch in top of left foot. - itchy rash breaking out on arms - mainly in antecubital area. - had covid 2-3 week ago and would like chest xray   Review of Systems  Constitutional:  Negative for diaphoresis.  Eyes:  Negative for pain.  Respiratory:  Negative for shortness of breath.   Cardiovascular:  Negative for chest pain, palpitations and leg swelling.  Gastrointestinal:  Negative for abdominal pain.  Endocrine: Negative for polydipsia.  Skin:  Negative for rash.  Neurological:  Negative for dizziness, weakness and headaches.  Hematological:  Does not bruise/bleed easily.  All other systems reviewed and are negative.     Objective:   Physical Exam Vitals reviewed.  Constitutional:      Appearance: Normal appearance. He is obese.  Cardiovascular:     Rate and Rhythm: Normal rate and regular rhythm.     Heart sounds: Normal heart sounds.  Pulmonary:     Breath sounds: Normal breath sounds.  Skin:    General: Skin is warm.     Comments: Erythematous silvery plaque patch on dorsal surface of left foot Has similar looking rash in bilateral anticubital area.  Neurological:     General: No focal deficit present.     Mental Status: He is alert and oriented to person, place, and time.     BP 134/77    Pulse 72    Temp 97.7 F (36.5 C) (Temporal)    Resp 20    Ht $R'6\' 7"'ML$  (2.007 m)    Wt (!) 306 lb (138.8 kg)    SpO2 98%    BMI 34.47 kg/m       Assessment & Plan:  Kevin Beck in today with chief complaint of Blister on left foot and Rash on both arms   1. Long COVID Pending radiology report - DG Chest 2 View  2. Psoriasis Do not scrathch Cool compresses RTO  prn - predniSONE (STERAPRED UNI-PAK 21 TAB) 10 MG (21) TBPK tablet; As directed x 6 days  Dispense: 21 tablet; Refill: 0 - triamcinolone cream (KENALOG) 0.1 %; Apply 1 application topically 2 (two) times daily.  Dispense: 453.6 g; Refill: 1  Orders Placed This Encounter  Procedures   DG Chest 2 View    Order Specific Question:   Reason for Exam (SYMPTOM  OR DIAGNOSIS REQUIRED)    Answer:   covid    Order Specific Question:   Preferred imaging location?    Answer:   Internal   CBC with Differential/Platelet   Thyroid Panel With TSH   CMP14+EGFR     The above assessment and management plan was discussed with the patient. The patient verbalized understanding of and has agreed to the management plan. Patient is aware to call the clinic if symptoms persist or worsen. Patient is aware when to return to the clinic for a follow-up visit. Patient educated on when it is appropriate to go to the emergency department.   Mary-Margaret Hassell Done, FNP

## 2021-02-28 LAB — CBC WITH DIFFERENTIAL/PLATELET
Basophils Absolute: 0.1 10*3/uL (ref 0.0–0.2)
Basos: 2 %
EOS (ABSOLUTE): 0.1 10*3/uL (ref 0.0–0.4)
Eos: 3 %
Hematocrit: 48.8 % (ref 37.5–51.0)
Hemoglobin: 16.9 g/dL (ref 13.0–17.7)
Immature Grans (Abs): 0 10*3/uL (ref 0.0–0.1)
Immature Granulocytes: 0 %
Lymphocytes Absolute: 1.6 10*3/uL (ref 0.7–3.1)
Lymphs: 29 %
MCH: 30.9 pg (ref 26.6–33.0)
MCHC: 34.6 g/dL (ref 31.5–35.7)
MCV: 89 fL (ref 79–97)
Monocytes Absolute: 0.4 10*3/uL (ref 0.1–0.9)
Monocytes: 7 %
Neutrophils Absolute: 3.2 10*3/uL (ref 1.4–7.0)
Neutrophils: 59 %
Platelets: 180 10*3/uL (ref 150–450)
RBC: 5.47 x10E6/uL (ref 4.14–5.80)
RDW: 12.4 % (ref 11.6–15.4)
WBC: 5.4 10*3/uL (ref 3.4–10.8)

## 2021-02-28 LAB — THYROID PANEL WITH TSH
Free Thyroxine Index: 2.1 (ref 1.2–4.9)
T3 Uptake Ratio: 23 % — ABNORMAL LOW (ref 24–39)
T4, Total: 9.1 ug/dL (ref 4.5–12.0)
TSH: 1.18 u[IU]/mL (ref 0.450–4.500)

## 2021-02-28 LAB — CMP14+EGFR
ALT: 22 IU/L (ref 0–44)
AST: 17 IU/L (ref 0–40)
Albumin/Globulin Ratio: 1.9 (ref 1.2–2.2)
Albumin: 4.2 g/dL (ref 3.8–4.9)
Alkaline Phosphatase: 138 IU/L — ABNORMAL HIGH (ref 44–121)
BUN/Creatinine Ratio: 12 (ref 9–20)
BUN: 12 mg/dL (ref 6–24)
Bilirubin Total: 0.2 mg/dL (ref 0.0–1.2)
CO2: 24 mmol/L (ref 20–29)
Calcium: 9.1 mg/dL (ref 8.7–10.2)
Chloride: 103 mmol/L (ref 96–106)
Creatinine, Ser: 1.01 mg/dL (ref 0.76–1.27)
Globulin, Total: 2.2 g/dL (ref 1.5–4.5)
Glucose: 146 mg/dL — ABNORMAL HIGH (ref 70–99)
Potassium: 4.3 mmol/L (ref 3.5–5.2)
Sodium: 139 mmol/L (ref 134–144)
Total Protein: 6.4 g/dL (ref 6.0–8.5)
eGFR: 89 mL/min/{1.73_m2} (ref >59)

## 2021-03-08 ENCOUNTER — Ambulatory Visit: Payer: PRIVATE HEALTH INSURANCE | Admitting: Family Medicine

## 2021-04-13 ENCOUNTER — Ambulatory Visit (INDEPENDENT_AMBULATORY_CARE_PROVIDER_SITE_OTHER): Payer: No Typology Code available for payment source | Admitting: Family Medicine

## 2021-04-13 ENCOUNTER — Encounter: Payer: Self-pay | Admitting: Family Medicine

## 2021-04-13 ENCOUNTER — Ambulatory Visit (INDEPENDENT_AMBULATORY_CARE_PROVIDER_SITE_OTHER): Payer: No Typology Code available for payment source

## 2021-04-13 VITALS — BP 120/67 | HR 83 | Temp 98.4°F | Ht 79.0 in | Wt 303.8 lb

## 2021-04-13 DIAGNOSIS — M25552 Pain in left hip: Secondary | ICD-10-CM | POA: Diagnosis not present

## 2021-04-13 DIAGNOSIS — R102 Pelvic and perineal pain: Secondary | ICD-10-CM | POA: Diagnosis not present

## 2021-04-13 LAB — URINALYSIS
Bilirubin, UA: NEGATIVE
Glucose, UA: NEGATIVE
Ketones, UA: NEGATIVE
Leukocytes,UA: NEGATIVE
Nitrite, UA: NEGATIVE
Protein,UA: NEGATIVE
RBC, UA: NEGATIVE
Specific Gravity, UA: >1.03 — ABNORMAL HIGH (ref 1.005–1.030)
Urobilinogen, Ur: 0.2 mg/dL (ref 0.2–1.0)
pH, UA: 5 (ref 5.0–7.5)

## 2021-04-13 MED ORDER — PREDNISONE 20 MG PO TABS: ORAL_TABLET | ORAL | 0 refills | Status: DC

## 2021-04-13 MED ORDER — LEVOFLOXACIN 500 MG PO TABS: 500.0000 mg | ORAL_TABLET | Freq: Every day | ORAL | 0 refills | Status: DC
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
## 2021-04-15 ENCOUNTER — Encounter: Payer: Self-pay | Admitting: Family Medicine

## 2021-05-11 ENCOUNTER — Ambulatory Visit (INDEPENDENT_AMBULATORY_CARE_PROVIDER_SITE_OTHER): Payer: No Typology Code available for payment source | Admitting: Family Medicine

## 2021-05-11 ENCOUNTER — Encounter: Payer: Self-pay | Admitting: Family Medicine

## 2021-05-11 VITALS — BP 122/67 | HR 82 | Temp 98.5°F | Ht 79.0 in | Wt 304.0 lb

## 2021-05-11 DIAGNOSIS — R053 Chronic cough: Secondary | ICD-10-CM

## 2021-05-11 DIAGNOSIS — M25552 Pain in left hip: Secondary | ICD-10-CM | POA: Diagnosis not present

## 2021-05-11 MED ORDER — CELECOXIB 200 MG PO CAPS: 200.0000 mg | ORAL_CAPSULE | Freq: Every day | ORAL | 5 refills | Status: DC

## 2021-05-11 MED ORDER — UMECLIDINIUM-VILANTEROL 62.5-25 MCG/ACT IN AEPB: 1.0000 | INHALATION_SPRAY | Freq: Every day | RESPIRATORY_TRACT | 11 refills | Status: DC

## 2021-05-11 MED ORDER — CHANTIX STARTING MONTH PAK 0.5 MG X 11 & 1 MG X 42 PO TBPK: 1.0000 | ORAL_TABLET | Freq: Two times a day (BID) | ORAL | 0 refills | Status: DC

## 2021-05-11 MED ORDER — UMECLIDINIUM-VILANTEROL 62.5-25 MCG/ACT IN AEPB
1.0000 | INHALATION_SPRAY | Freq: Every day | RESPIRATORY_TRACT | 11 refills | Status: DC
Start: 2021-05-11 — End: 2022-06-05

## 2021-05-11 MED ORDER — DICLOFENAC SODIUM 75 MG PO TBEC: 75.0000 mg | DELAYED_RELEASE_TABLET | Freq: Two times a day (BID) | ORAL | 2 refills | Status: DC

## 2021-05-11 MED ORDER — RIVAROXABAN (XARELTO) VTE STARTER PACK (15 & 20 MG): ORAL_TABLET | ORAL | 0 refills | Status: DC
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
## 2021-05-22 ENCOUNTER — Ambulatory Visit: Payer: 59 | Attending: Family Medicine

## 2021-05-22 DIAGNOSIS — M25552 Pain in left hip: Secondary | ICD-10-CM | POA: Diagnosis present
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
## 2021-06-26 ENCOUNTER — Encounter: Payer: Self-pay | Admitting: Family Medicine

## 2021-06-26 ENCOUNTER — Ambulatory Visit (INDEPENDENT_AMBULATORY_CARE_PROVIDER_SITE_OTHER): Payer: No Typology Code available for payment source | Admitting: Family Medicine

## 2021-06-26 VITALS — BP 112/63 | HR 74 | Temp 98.0°F | Ht 79.0 in | Wt 304.4 lb

## 2021-06-26 DIAGNOSIS — M25552 Pain in left hip: Secondary | ICD-10-CM

## 2021-06-26 DIAGNOSIS — Z7901 Long term (current) use of anticoagulants: Secondary | ICD-10-CM

## 2021-06-26 DIAGNOSIS — Z720 Tobacco use: Secondary | ICD-10-CM

## 2021-06-26 DIAGNOSIS — L301 Dyshidrosis [pompholyx]: Secondary | ICD-10-CM

## 2021-06-26 DIAGNOSIS — B351 Tinea unguium: Secondary | ICD-10-CM

## 2021-06-26 MED ORDER — VARENICLINE TARTRATE 1 MG PO TABS: 1.0000 mg | ORAL_TABLET | Freq: Two times a day (BID) | ORAL | 5 refills | Status: DC

## 2021-06-26 MED ORDER — BETAMETHASONE DIPROPIONATE AUG 0.05 % EX CREA: TOPICAL_CREAM | Freq: Two times a day (BID) | CUTANEOUS | 1 refills | Status: DC

## 2021-06-26 MED ORDER — TERBINAFINE HCL 250 MG PO TABS: 250.0000 mg | ORAL_TABLET | Freq: Every day | ORAL | 2 refills | Status: DC

## 2021-06-26 MED ORDER — RIVAROXABAN 20 MG PO TABS: 20.0000 mg | ORAL_TABLET | Freq: Every day | ORAL | 2 refills | Status: DC
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
## 2021-09-27 ENCOUNTER — Ambulatory Visit: Payer: No Typology Code available for payment source | Admitting: Family Medicine

## 2021-09-28 ENCOUNTER — Encounter: Payer: Self-pay | Admitting: Family Medicine

## 2021-11-13 ENCOUNTER — Ambulatory Visit (INDEPENDENT_AMBULATORY_CARE_PROVIDER_SITE_OTHER): Payer: No Typology Code available for payment source | Admitting: Family Medicine

## 2021-11-13 ENCOUNTER — Encounter: Payer: Self-pay | Admitting: Family Medicine

## 2021-11-13 VITALS — BP 110/64 | HR 88 | Temp 97.7°F | Ht 79.0 in | Wt 307.2 lb

## 2021-11-13 DIAGNOSIS — B354 Tinea corporis: Secondary | ICD-10-CM | POA: Diagnosis not present

## 2021-11-13 DIAGNOSIS — J441 Chronic obstructive pulmonary disease with (acute) exacerbation: Secondary | ICD-10-CM

## 2021-11-13 DIAGNOSIS — L409 Psoriasis, unspecified: Secondary | ICD-10-CM

## 2021-11-13 MED ORDER — CLOTRIMAZOLE 1 % EX CREA: 1.0000 | TOPICAL_CREAM | Freq: Two times a day (BID) | CUTANEOUS | 0 refills | Status: DC

## 2021-11-13 MED ORDER — AZITHROMYCIN 250 MG PO TABS: ORAL_TABLET | ORAL | 0 refills | Status: DC

## 2021-11-13 MED ORDER — PREDNISONE 20 MG PO TABS: 40.0000 mg | ORAL_TABLET | Freq: Every day | ORAL | 0 refills | Status: DC

## 2021-11-13 NOTE — Progress Notes (Signed)
Acute Office Visit  Subjective:     Patient ID: Kevin Beck, male    DOB: 05/24/1965, 56 y.o.   MRN: [id number]  Chief Complaint  Patient presents with   Rash    Rash   Patient is in today for a rash. This has been present on both hands and arms for the last two week. It is bumpy, scaly and itching. He has been taking benadryl and using his steroid cream with some improvement. This rash looks the same as the rash on his feet and he has been told in the past that this is psoriasis. He has not seen dermatology. He denies new medications, foods, or products.   He has a spot on his left side that has been present for about 2 months.This has gotten larger despite treatment with steroid cream. It is also red, scaly, and itchy. It is in a circular shape. He does work with cattle.   He has a history of COPD. Reports increased cough with brown sputum, mild shortness of breath, cough, and congestion for the last few days. He denies fever, chest pain. He has been taking mucinex without improvement.   Review of Systems  Skin:  Positive for rash.   As per HPI.     Objective:    BP 110/64   Pulse 88   Temp 97.7 F (36.5 C) (Temporal)   Ht '6\' 7"'$  (2.007 m)   Wt (!) 307 lb 4 oz (139.4 kg)   SpO2 95%   BMI 34.61 kg/m    Physical Exam Vitals and nursing note reviewed.  Constitutional:      General: He is not in acute distress.    Appearance: He is not ill-appearing, toxic-appearing or diaphoretic.  Cardiovascular:     Rate and Rhythm: Normal rate and regular rhythm.     Heart sounds: Normal heart sounds. No murmur heard. Pulmonary:     Effort: Pulmonary effort is normal. No respiratory distress.     Breath sounds: Examination of the right-middle field reveals wheezing. Examination of the left-middle field reveals wheezing. Examination of the right-lower field reveals wheezing. Examination of the left-lower field reveals wheezing. Wheezing present. No rhonchi or rales.  Skin:     General: Skin is warm and dry.     Comments: Erythematous rash with silver plaques to bilateral hands and arms.  Circular erythematous rash to left abdomen.  Neurological:     General: No focal deficit present.     Mental Status: He is alert and oriented to person, place, and time.     Motor: No weakness.     Gait: Gait normal.  Psychiatric:        Mood and Affect: Mood normal.        Behavior: Behavior normal.     No results found for any visits on 11/13/21.      Assessment & Plan:   Theadore was seen today for rash.  Diagnoses and all orders for this visit:  Psoriasis Prednisone burst. Continue steroid cream. Referral to derm placed.  -     predniSONE (DELTASONE) 20 MG tablet; Take 2 tablets (40 mg total) by mouth daily with breakfast. -     Ambulatory referral to Dermatology  Ringworm of body Clotrimazole as below.  -     clotrimazole (CLOTRIMAZOLE ANTI-FUNGAL) 1 % cream; Apply 1 Application topically 2 (two) times daily.  COPD exacerbation (HCC) Prednisone burst and zpak as below.  -     predniSONE (DELTASONE)  20 MG tablet; Take 2 tablets (40 mg total) by mouth daily with breakfast. -     azithromycin (ZITHROMAX Z-PAK) 250 MG tablet; As directed  Return to office for new or worsening symptoms, or if symptoms persist.   The patient indicates understanding of these issues and agrees with the plan.  Gwenlyn Perking, FNP

## 2021-11-13 NOTE — Patient Instructions (Signed)
Body Ringworm Body ringworm is an infection of the skin that often causes a ring-shaped rash. Body ringworm is also called tinea corporis. Body ringworm can affect any part of your skin. This condition is easily spread from person to person (is very contagious). What are the causes? This condition is caused by fungi called dermatophytes. The condition develops when these fungi grow out of control on the skin. You can get this condition if you touch a person or animal that has it. You can also get it if you share any items with an infected person or pet. These include: Clothing, bedding, and towels. Brushes or combs. Gym equipment. Any other object that has the fungus on it. What increases the risk? You are more likely to develop this condition if you: Play sports that involve close physical contact, such as wrestling. Sweat a lot. Live in areas that are hot and humid. Use public showers. Have a weakened immune system. What are the signs or symptoms? Symptoms of this condition include: Itchy, raised red spots and bumps. Red scaly patches. A ring-shaped rash. The rash may have: A clear center. Scales or red bumps at its center. Redness near its borders. Dry and scaly skin on or around it. How is this diagnosed? This condition can usually be diagnosed with a skin exam. A skin scraping may be taken from the affected area and examined under a microscope to see if the fungus is present. How is this treated? This condition may be treated with: An antifungal cream or ointment. An antifungal shampoo. Antifungal medicines. These may be prescribed if your ringworm: Is severe. Keeps coming back. Lasts a long time. Follow these instructions at home: Take over-the-counter and prescription medicines only as told by your health care provider. If you were given an antifungal cream or ointment: Use it as told by your health care provider. Wash the infected area and dry it completely before  applying the cream or ointment. If you were given an antifungal shampoo: Use it as told by your health care provider. Leave the shampoo on your body for 3-5 minutes before rinsing. While you have a rash: Wear loose clothing to stop clothes from rubbing and irritating it. Wash or change your bed sheets every night. Disinfect or throw out items that may be infected. Wash clothes and bed sheets in hot water. Wash your hands often with soap and water. If soap and water are not available, use hand sanitizer. If your pet has the same infection, take your pet to see a veterinarian for treatment. How is this prevented? Take a bath or shower every day and after every time you work out or play sports. Dry your skin completely after bathing. Wear sandals or shoes in public places and showers. Change your clothes every day. Wash athletic clothes after each use. Do not share personal items with others. Avoid touching red patches of skin on other people. Avoid touching pets that have bald spots. If you touch an animal that has a bald spot, wash your hands. Contact a health care provider if: Your rash continues to spread after 7 days of treatment. Your rash is not gone in 4 weeks. The area around your rash gets red, warm, tender, and swollen. Summary Body ringworm is an infection of the skin that often causes a ring-shaped rash. This condition is easily spread from person to person (is very contagious). This condition may be treated with antifungal cream or ointment, antifungal shampoo, or antifungal medicines. Take over-the-counter and   prescription medicines only as told by your health care provider. This information is not intended to replace advice given to you by your health care provider. Make sure you discuss any questions you have with your health care provider. Document Revised: 04/19/2021 Document Reviewed: 11/29/2020 Elsevier Patient Education  [place].

## 2021-12-04 ENCOUNTER — Encounter: Payer: Self-pay | Admitting: Family Medicine

## 2021-12-04 ENCOUNTER — Ambulatory Visit (INDEPENDENT_AMBULATORY_CARE_PROVIDER_SITE_OTHER): Payer: No Typology Code available for payment source | Admitting: Family Medicine

## 2021-12-04 VITALS — BP 123/79 | HR 74 | Temp 97.1°F | Ht 79.0 in | Wt 305.0 lb

## 2021-12-04 DIAGNOSIS — Z7901 Long term (current) use of anticoagulants: Secondary | ICD-10-CM | POA: Diagnosis not present

## 2021-12-04 DIAGNOSIS — E559 Vitamin D deficiency, unspecified: Secondary | ICD-10-CM | POA: Diagnosis not present

## 2021-12-04 DIAGNOSIS — Z122 Encounter for screening for malignant neoplasm of respiratory organs: Secondary | ICD-10-CM

## 2021-12-04 DIAGNOSIS — Z13220 Encounter for screening for lipoid disorders: Secondary | ICD-10-CM | POA: Diagnosis not present

## 2021-12-04 DIAGNOSIS — F17200 Nicotine dependence, unspecified, uncomplicated: Secondary | ICD-10-CM

## 2021-12-04 DIAGNOSIS — L2084 Intrinsic (allergic) eczema: Secondary | ICD-10-CM

## 2021-12-04 MED ORDER — CETIRIZINE HCL 10 MG PO TABS: 10.0000 mg | ORAL_TABLET | Freq: Every day | ORAL | 3 refills | Status: DC

## 2021-12-04 MED ORDER — HALOBETASOL PROPIONATE 0.05 % EX OINT: TOPICAL_OINTMENT | Freq: Two times a day (BID) | CUTANEOUS | 2 refills | Status: DC
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
## 2021-12-05 ENCOUNTER — Other Ambulatory Visit: Payer: Self-pay | Admitting: *Deleted

## 2021-12-05 LAB — LIPID PANEL
Chol/HDL Ratio: 4.1 ratio (ref 0.0–5.0)
Cholesterol, Total: 176 mg/dL (ref 100–199)
HDL: 43 mg/dL (ref >39)
LDL Chol Calc (NIH): 112 mg/dL — ABNORMAL HIGH (ref 0–99)
Triglycerides: 117 mg/dL (ref 0–149)
VLDL Cholesterol Cal: 21 mg/dL (ref 5–40)

## 2021-12-05 LAB — CMP14+EGFR
ALT: 29 IU/L (ref 0–44)
AST: 20 IU/L (ref 0–40)
Albumin/Globulin Ratio: 1.3 (ref 1.2–2.2)
Albumin: 3.8 g/dL (ref 3.8–4.9)
Alkaline Phosphatase: 124 IU/L — ABNORMAL HIGH (ref 44–121)
BUN/Creatinine Ratio: 13 (ref 9–20)
BUN: 12 mg/dL (ref 6–24)
Bilirubin Total: 0.4 mg/dL (ref 0.0–1.2)
CO2: 24 mmol/L (ref 20–29)
Calcium: 8.6 mg/dL — ABNORMAL LOW (ref 8.7–10.2)
Chloride: 103 mmol/L (ref 96–106)
Creatinine, Ser: 0.95 mg/dL (ref 0.76–1.27)
Globulin, Total: 2.9 g/dL (ref 1.5–4.5)
Glucose: 116 mg/dL — ABNORMAL HIGH (ref 70–99)
Potassium: 4.3 mmol/L (ref 3.5–5.2)
Sodium: 140 mmol/L (ref 134–144)
Total Protein: 6.7 g/dL (ref 6.0–8.5)
eGFR: 95 mL/min/{1.73_m2} (ref >59)

## 2021-12-05 LAB — CBC WITH DIFFERENTIAL/PLATELET
Basophils Absolute: 0.1 10*3/uL (ref 0.0–0.2)
Basos: 1 %
EOS (ABSOLUTE): 0.2 10*3/uL (ref 0.0–0.4)
Eos: 4 %
Hematocrit: 48.6 % (ref 37.5–51.0)
Hemoglobin: 16.9 g/dL (ref 13.0–17.7)
Immature Grans (Abs): 0 10*3/uL (ref 0.0–0.1)
Immature Granulocytes: 0 %
Lymphocytes Absolute: 1.6 10*3/uL (ref 0.7–3.1)
Lymphs: 30 %
MCH: 30.9 pg (ref 26.6–33.0)
MCHC: 34.8 g/dL (ref 31.5–35.7)
MCV: 89 fL (ref 79–97)
Monocytes Absolute: 0.4 10*3/uL (ref 0.1–0.9)
Monocytes: 7 %
Neutrophils Absolute: 3.1 10*3/uL (ref 1.4–7.0)
Neutrophils: 58 %
Platelets: 164 10*3/uL (ref 150–450)
RBC: 5.47 x10E6/uL (ref 4.14–5.80)
RDW: 12.5 % (ref 11.6–15.4)
WBC: 5.4 10*3/uL (ref 3.4–10.8)

## 2021-12-05 LAB — VITAMIN D 25 HYDROXY (VIT D DEFICIENCY, FRACTURES): Vit D, 25-Hydroxy: 21.2 ng/mL — ABNORMAL LOW (ref 30.0–100.0)

## 2021-12-05 MED ORDER — VITAMIN D (ERGOCALCIFEROL) 1.25 MG (50000 UNIT) PO CAPS: 50000.0000 [IU] | ORAL_CAPSULE | ORAL | 3 refills | Status: DC

## 2021-12-05 NOTE — Progress Notes (Signed)
vitam

## 2021-12-05 NOTE — Progress Notes (Signed)
Dear Kevin Beck, Your Vitamin D is  low. You need a prescription strength supplement I will send that in for you. Nurse, if at all possible, could you send in a prescription for the patient for vitamin D 50,000 units, 1 p.o. weekly #13 with 3 refills? Many thanks, WS

## 2021-12-06 ENCOUNTER — Telehealth: Payer: Self-pay | Admitting: Family Medicine

## 2021-12-06 NOTE — Telephone Encounter (Signed)
ORDER FAXED

## 2022-03-16 ENCOUNTER — Other Ambulatory Visit: Payer: Self-pay | Admitting: Family Medicine

## 2022-04-24 DIAGNOSIS — H5213 Myopia, bilateral: Secondary | ICD-10-CM | POA: Diagnosis not present

## 2022-04-24 DIAGNOSIS — H524 Presbyopia: Secondary | ICD-10-CM | POA: Diagnosis not present

## 2022-04-24 DIAGNOSIS — H52223 Regular astigmatism, bilateral: Secondary | ICD-10-CM | POA: Diagnosis not present

## 2022-04-24 DIAGNOSIS — H2513 Age-related nuclear cataract, bilateral: Secondary | ICD-10-CM | POA: Diagnosis not present

## 2022-05-01 DIAGNOSIS — Z720 Tobacco use: Secondary | ICD-10-CM | POA: Diagnosis not present

## 2022-05-01 DIAGNOSIS — Z791 Long term (current) use of non-steroidal anti-inflammatories (NSAID): Secondary | ICD-10-CM | POA: Diagnosis not present

## 2022-05-01 DIAGNOSIS — L309 Dermatitis, unspecified: Secondary | ICD-10-CM | POA: Diagnosis not present

## 2022-05-01 DIAGNOSIS — Z008 Encounter for other general examination: Secondary | ICD-10-CM | POA: Diagnosis not present

## 2022-05-01 DIAGNOSIS — Z8249 Family history of ischemic heart disease and other diseases of the circulatory system: Secondary | ICD-10-CM | POA: Diagnosis not present

## 2022-05-01 DIAGNOSIS — I2782 Chronic pulmonary embolism: Secondary | ICD-10-CM | POA: Diagnosis not present

## 2022-05-01 DIAGNOSIS — M1990 Unspecified osteoarthritis, unspecified site: Secondary | ICD-10-CM | POA: Diagnosis not present

## 2022-05-01 DIAGNOSIS — R030 Elevated blood-pressure reading, without diagnosis of hypertension: Secondary | ICD-10-CM | POA: Diagnosis not present

## 2022-05-01 DIAGNOSIS — N529 Male erectile dysfunction, unspecified: Secondary | ICD-10-CM | POA: Diagnosis not present

## 2022-05-01 DIAGNOSIS — Z825 Family history of asthma and other chronic lower respiratory diseases: Secondary | ICD-10-CM | POA: Diagnosis not present

## 2022-05-01 DIAGNOSIS — Z880 Allergy status to penicillin: Secondary | ICD-10-CM | POA: Diagnosis not present

## 2022-05-01 DIAGNOSIS — J449 Chronic obstructive pulmonary disease, unspecified: Secondary | ICD-10-CM | POA: Diagnosis not present

## 2022-05-01 DIAGNOSIS — E669 Obesity, unspecified: Secondary | ICD-10-CM | POA: Diagnosis not present

## 2022-05-03 DIAGNOSIS — H2513 Age-related nuclear cataract, bilateral: Secondary | ICD-10-CM | POA: Diagnosis not present

## 2022-05-03 DIAGNOSIS — H43823 Vitreomacular adhesion, bilateral: Secondary | ICD-10-CM | POA: Diagnosis not present

## 2022-05-03 DIAGNOSIS — H35721 Serous detachment of retinal pigment epithelium, right eye: Secondary | ICD-10-CM | POA: Diagnosis not present

## 2022-05-28 DIAGNOSIS — E669 Obesity, unspecified: Secondary | ICD-10-CM | POA: Diagnosis not present

## 2022-05-28 DIAGNOSIS — Z6836 Body mass index (BMI) 36.0-36.9, adult: Secondary | ICD-10-CM | POA: Diagnosis not present

## 2022-05-28 DIAGNOSIS — R059 Cough, unspecified: Secondary | ICD-10-CM | POA: Diagnosis not present

## 2022-05-28 DIAGNOSIS — J00 Acute nasopharyngitis [common cold]: Secondary | ICD-10-CM | POA: Diagnosis not present

## 2022-05-28 DIAGNOSIS — R030 Elevated blood-pressure reading, without diagnosis of hypertension: Secondary | ICD-10-CM | POA: Diagnosis not present

## 2022-06-05 ENCOUNTER — Ambulatory Visit (INDEPENDENT_AMBULATORY_CARE_PROVIDER_SITE_OTHER): Payer: Medicare HMO | Admitting: Family Medicine

## 2022-06-05 ENCOUNTER — Ambulatory Visit (INDEPENDENT_AMBULATORY_CARE_PROVIDER_SITE_OTHER): Payer: Medicare HMO

## 2022-06-05 ENCOUNTER — Encounter: Payer: Self-pay | Admitting: Family Medicine

## 2022-06-05 VITALS — BP 136/83 | HR 84 | Temp 96.5°F | Ht 79.0 in | Wt 304.2 lb

## 2022-06-05 DIAGNOSIS — J329 Chronic sinusitis, unspecified: Secondary | ICD-10-CM | POA: Diagnosis not present

## 2022-06-05 DIAGNOSIS — E785 Hyperlipidemia, unspecified: Secondary | ICD-10-CM

## 2022-06-05 DIAGNOSIS — M25552 Pain in left hip: Secondary | ICD-10-CM

## 2022-06-05 DIAGNOSIS — R0600 Dyspnea, unspecified: Secondary | ICD-10-CM | POA: Diagnosis not present

## 2022-06-05 DIAGNOSIS — R059 Cough, unspecified: Secondary | ICD-10-CM | POA: Diagnosis not present

## 2022-06-05 DIAGNOSIS — J449 Chronic obstructive pulmonary disease, unspecified: Secondary | ICD-10-CM | POA: Diagnosis not present

## 2022-06-05 DIAGNOSIS — Z7901 Long term (current) use of anticoagulants: Secondary | ICD-10-CM | POA: Diagnosis not present

## 2022-06-05 DIAGNOSIS — D485 Neoplasm of uncertain behavior of skin: Secondary | ICD-10-CM

## 2022-06-05 DIAGNOSIS — J984 Other disorders of lung: Secondary | ICD-10-CM

## 2022-06-05 DIAGNOSIS — J40 Bronchitis, not specified as acute or chronic: Secondary | ICD-10-CM

## 2022-06-05 DIAGNOSIS — Z125 Encounter for screening for malignant neoplasm of prostate: Secondary | ICD-10-CM | POA: Diagnosis not present

## 2022-06-05 MED ORDER — CEFTRIAXONE SODIUM 1 G IJ SOLR
1.0000 g | Freq: Once | INTRAMUSCULAR | Status: AC
Start: 2022-06-05 — End: 2022-06-05
  Administered 2022-06-05: 1 g via INTRAMUSCULAR

## 2022-06-05 MED ORDER — MOXIFLOXACIN HCL 400 MG PO TABS: 400.0000 mg | ORAL_TABLET | Freq: Every day | ORAL | 0 refills | Status: DC

## 2022-06-05 MED ORDER — BETAMETHASONE SOD PHOS & ACET 6 (3-3) MG/ML IJ SUSP
6.0000 mg | Freq: Once | INTRAMUSCULAR | Status: AC
  Administered 2022-06-05: 6 mg via INTRAMUSCULAR

## 2022-06-05 MED ORDER — UMECLIDINIUM-VILANTEROL 62.5-25 MCG/ACT IN AEPB: 1.0000 | INHALATION_SPRAY | Freq: Every day | RESPIRATORY_TRACT | 11 refills | Status: DC

## 2022-06-05 NOTE — Progress Notes (Signed)
No chief complaint on file.   HPI  Patient presents today for Patient presents with upper respiratory congestion. Rhinorrhea that is purulent. There is moderate sore throat. Patient reports coughing frequently as well.  Green/brown sputum noted. Urgent care did covid & Flu test, negativeThere is no fever, chills. Awakening with  sweats. The patient denies being short of breath. Onset was 11days ago. Gradually worsening. Tried OTCs without improvement.  PMH: Smoking status noted ROS: Per HPI  Objective: BP 136/83   Pulse 84   Temp (!) 96.5 F (35.8 C)   Ht  (2.007 m)   Wt (!) 304 lb 3.2 oz (138 kg)   SpO2 96%   BMI 34.27 kg/m  Gen: NAD, alert, cooperative with exam HEENT: NCAT, Nasal passages swollen, red TMS RED CV: RRR, good S1/S2, no murmur Resp: Bronchitis changes with scattered wheezes, non-labored Ext: No edema, warm Neuro: Alert and oriented, No gross deficits  Assessment and plan:  1. Hyperlipidemia, unspecified hyperlipidemia type   2. Prostate cancer screening   3. Sinobronchitis   4. Long term current use of anticoagulant therapy   5. Small airways disease   6. Chronic restrictive lung disease   7. Left hip pain   8. Neoplasm of uncertain behavior of skin     Meds ordered this encounter  Medications   umeclidinium-vilanterol (ANORO ELLIPTA) 62.5-25 MCG/ACT AEPB    Sig: Inhale 1 puff into the lungs daily at 6 (six) AM.    Dispense:  60 each    Refill:  11   moxifloxacin (AVELOX) 400 MG tablet    Sig: Take 1 tablet (400 mg total) by mouth daily.    Dispense:  10 tablet    Refill:  0   betamethasone acetate-betamethasone sodium phosphate (CELESTONE) injection 6 mg   cefTRIAXone (ROCEPHIN) injection 1 g    Orders Placed This Encounter  Procedures   DG Chest 2 View    Standing Status:   Future    Standing Expiration Date:   07/05/2022    Order Specific Question:   Reason for Exam (SYMPTOM  OR DIAGNOSIS REQUIRED)    Answer:   cough, dyspnea, COPD     Order Specific Question:   Preferred imaging location?    Answer:   Internal   CBC with Differential/Platelet   CMP14+EGFR    Order Specific Question:   Has the patient fasted?    Answer:   Yes    Order Specific Question:   Release to patient    Answer:   Immediate   PSA, total and free   Ambulatory referral to Dermatology    Referral Priority:   Routine    Referral Type:   Consultation    Referral Reason:   Specialty Services Required    Requested Specialty:   Dermatology    Number of Visits Requested:   1    Follow up as needed.  Mechele Claude, MD

## 2022-06-09 DIAGNOSIS — R509 Fever, unspecified: Secondary | ICD-10-CM | POA: Diagnosis not present

## 2022-06-09 DIAGNOSIS — J029 Acute pharyngitis, unspecified: Secondary | ICD-10-CM | POA: Diagnosis not present

## 2022-06-09 DIAGNOSIS — J441 Chronic obstructive pulmonary disease with (acute) exacerbation: Secondary | ICD-10-CM | POA: Diagnosis not present

## 2022-06-11 NOTE — Progress Notes (Signed)
Your chest x-ray looked normal. Thanks, WS.

## 2022-06-25 DIAGNOSIS — M25552 Pain in left hip: Secondary | ICD-10-CM | POA: Diagnosis not present

## 2022-07-04 DIAGNOSIS — L820 Inflamed seborrheic keratosis: Secondary | ICD-10-CM | POA: Diagnosis not present

## 2022-07-04 DIAGNOSIS — I781 Nevus, non-neoplastic: Secondary | ICD-10-CM | POA: Diagnosis not present

## 2022-07-04 DIAGNOSIS — B078 Other viral warts: Secondary | ICD-10-CM | POA: Diagnosis not present

## 2022-07-04 DIAGNOSIS — L570 Actinic keratosis: Secondary | ICD-10-CM | POA: Diagnosis not present

## 2022-07-04 DIAGNOSIS — X32XXXA Exposure to sunlight, initial encounter: Secondary | ICD-10-CM | POA: Diagnosis not present

## 2022-09-13 DIAGNOSIS — B351 Tinea unguium: Secondary | ICD-10-CM | POA: Diagnosis not present

## 2022-09-13 DIAGNOSIS — M79676 Pain in unspecified toe(s): Secondary | ICD-10-CM | POA: Diagnosis not present

## 2022-09-13 DIAGNOSIS — H2513 Age-related nuclear cataract, bilateral: Secondary | ICD-10-CM | POA: Diagnosis not present

## 2022-09-13 DIAGNOSIS — H35721 Serous detachment of retinal pigment epithelium, right eye: Secondary | ICD-10-CM | POA: Diagnosis not present

## 2022-09-13 DIAGNOSIS — H04123 Dry eye syndrome of bilateral lacrimal glands: Secondary | ICD-10-CM | POA: Diagnosis not present

## 2022-09-14 DIAGNOSIS — I10 Essential (primary) hypertension: Secondary | ICD-10-CM | POA: Diagnosis not present

## 2022-09-14 DIAGNOSIS — M1990 Unspecified osteoarthritis, unspecified site: Secondary | ICD-10-CM | POA: Diagnosis not present

## 2022-09-14 DIAGNOSIS — F17210 Nicotine dependence, cigarettes, uncomplicated: Secondary | ICD-10-CM | POA: Diagnosis not present

## 2022-09-14 DIAGNOSIS — Z880 Allergy status to penicillin: Secondary | ICD-10-CM | POA: Diagnosis not present

## 2022-09-14 DIAGNOSIS — L02811 Cutaneous abscess of head [any part, except face]: Secondary | ICD-10-CM | POA: Diagnosis not present

## 2022-09-14 DIAGNOSIS — Z7982 Long term (current) use of aspirin: Secondary | ICD-10-CM | POA: Diagnosis not present

## 2022-09-14 DIAGNOSIS — Z888 Allergy status to other drugs, medicaments and biological substances status: Secondary | ICD-10-CM | POA: Diagnosis not present

## 2022-09-14 DIAGNOSIS — R1012 Left upper quadrant pain: Secondary | ICD-10-CM | POA: Diagnosis not present

## 2022-09-18 DIAGNOSIS — L02811 Cutaneous abscess of head [any part, except face]: Secondary | ICD-10-CM | POA: Diagnosis not present

## 2022-09-28 ENCOUNTER — Encounter: Payer: Self-pay | Admitting: *Deleted

## 2022-10-01 DIAGNOSIS — Z79899 Other long term (current) drug therapy: Secondary | ICD-10-CM | POA: Diagnosis not present

## 2022-10-01 DIAGNOSIS — Z880 Allergy status to penicillin: Secondary | ICD-10-CM | POA: Diagnosis not present

## 2022-10-01 DIAGNOSIS — I10 Essential (primary) hypertension: Secondary | ICD-10-CM | POA: Diagnosis not present

## 2022-10-01 DIAGNOSIS — I2699 Other pulmonary embolism without acute cor pulmonale: Secondary | ICD-10-CM | POA: Diagnosis not present

## 2022-10-01 DIAGNOSIS — R079 Chest pain, unspecified: Secondary | ICD-10-CM | POA: Diagnosis not present

## 2022-10-01 DIAGNOSIS — F17210 Nicotine dependence, cigarettes, uncomplicated: Secondary | ICD-10-CM | POA: Diagnosis not present

## 2022-10-01 DIAGNOSIS — Z888 Allergy status to other drugs, medicaments and biological substances status: Secondary | ICD-10-CM | POA: Diagnosis not present

## 2022-10-01 DIAGNOSIS — M1990 Unspecified osteoarthritis, unspecified site: Secondary | ICD-10-CM | POA: Diagnosis not present

## 2022-10-01 DIAGNOSIS — Z7901 Long term (current) use of anticoagulants: Secondary | ICD-10-CM | POA: Diagnosis not present

## 2022-10-01 DIAGNOSIS — R0989 Other specified symptoms and signs involving the circulatory and respiratory systems: Secondary | ICD-10-CM | POA: Diagnosis not present

## 2022-10-01 DIAGNOSIS — R1013 Epigastric pain: Secondary | ICD-10-CM | POA: Diagnosis not present

## 2022-10-02 DIAGNOSIS — I2699 Other pulmonary embolism without acute cor pulmonale: Secondary | ICD-10-CM | POA: Diagnosis not present

## 2022-10-02 DIAGNOSIS — R0789 Other chest pain: Secondary | ICD-10-CM | POA: Diagnosis not present

## 2022-10-15 DIAGNOSIS — I2699 Other pulmonary embolism without acute cor pulmonale: Secondary | ICD-10-CM | POA: Diagnosis not present

## 2022-11-12 DIAGNOSIS — L209 Atopic dermatitis, unspecified: Secondary | ICD-10-CM | POA: Diagnosis not present

## 2022-11-12 DIAGNOSIS — R030 Elevated blood-pressure reading, without diagnosis of hypertension: Secondary | ICD-10-CM | POA: Diagnosis not present

## 2022-11-12 DIAGNOSIS — Z6837 Body mass index (BMI) 37.0-37.9, adult: Secondary | ICD-10-CM | POA: Diagnosis not present

## 2022-11-12 DIAGNOSIS — L500 Allergic urticaria: Secondary | ICD-10-CM | POA: Diagnosis not present

## 2022-11-12 DIAGNOSIS — L239 Allergic contact dermatitis, unspecified cause: Secondary | ICD-10-CM | POA: Diagnosis not present

## 2022-11-12 DIAGNOSIS — E669 Obesity, unspecified: Secondary | ICD-10-CM | POA: Diagnosis not present

## 2022-11-12 DIAGNOSIS — L0390 Cellulitis, unspecified: Secondary | ICD-10-CM | POA: Diagnosis not present

## 2022-11-13 DIAGNOSIS — M5442 Lumbago with sciatica, left side: Secondary | ICD-10-CM | POA: Diagnosis not present

## 2022-11-13 DIAGNOSIS — M5441 Lumbago with sciatica, right side: Secondary | ICD-10-CM | POA: Diagnosis not present

## 2022-11-23 ENCOUNTER — Other Ambulatory Visit: Payer: Self-pay | Admitting: Family Medicine

## 2022-11-23 DIAGNOSIS — Z1212 Encounter for screening for malignant neoplasm of rectum: Secondary | ICD-10-CM

## 2022-11-23 DIAGNOSIS — Z1211 Encounter for screening for malignant neoplasm of colon: Secondary | ICD-10-CM

## 2022-11-29 DIAGNOSIS — M79674 Pain in right toe(s): Secondary | ICD-10-CM | POA: Diagnosis not present

## 2022-11-29 DIAGNOSIS — B351 Tinea unguium: Secondary | ICD-10-CM | POA: Diagnosis not present

## 2022-12-04 ENCOUNTER — Other Ambulatory Visit: Payer: Self-pay | Admitting: Family Medicine

## 2022-12-05 ENCOUNTER — Ambulatory Visit: Payer: Medicare HMO | Admitting: Family Medicine

## 2022-12-05 ENCOUNTER — Encounter: Payer: Self-pay | Admitting: Family Medicine

## 2022-12-05 VITALS — BP 129/76 | HR 72 | Temp 97.0°F | Ht 79.0 in | Wt 307.8 lb

## 2022-12-05 DIAGNOSIS — E559 Vitamin D deficiency, unspecified: Secondary | ICD-10-CM

## 2022-12-05 DIAGNOSIS — G4762 Sleep related leg cramps: Secondary | ICD-10-CM | POA: Diagnosis not present

## 2022-12-05 DIAGNOSIS — I2699 Other pulmonary embolism without acute cor pulmonale: Secondary | ICD-10-CM | POA: Diagnosis not present

## 2022-12-05 DIAGNOSIS — Z23 Encounter for immunization: Secondary | ICD-10-CM | POA: Diagnosis not present

## 2022-12-05 DIAGNOSIS — Z1211 Encounter for screening for malignant neoplasm of colon: Secondary | ICD-10-CM

## 2022-12-05 DIAGNOSIS — R739 Hyperglycemia, unspecified: Secondary | ICD-10-CM | POA: Diagnosis not present

## 2022-12-05 MED ORDER — RIVAROXABAN 20 MG PO TABS: 20.0000 mg | ORAL_TABLET | Freq: Every day | ORAL | 3 refills | Status: DC

## 2022-12-05 MED ORDER — CETIRIZINE HCL 10 MG PO TABS: 10.0000 mg | ORAL_TABLET | Freq: Every day | ORAL | 3 refills | Status: AC
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
## 2022-12-06 LAB — CBC WITH DIFFERENTIAL/PLATELET
Basophils Absolute: 0.1 10*3/uL (ref 0.0–0.2)
Basos: 1 %
EOS (ABSOLUTE): 0.2 10*3/uL (ref 0.0–0.4)
Eos: 3 %
Hematocrit: 47.5 % (ref 37.5–51.0)
Hemoglobin: 15.9 g/dL (ref 13.0–17.7)
Immature Grans (Abs): 0 10*3/uL (ref 0.0–0.1)
Immature Granulocytes: 0 %
Lymphocytes Absolute: 1.5 10*3/uL (ref 0.7–3.1)
Lymphs: 29 %
MCH: 30 pg (ref 26.6–33.0)
MCHC: 33.5 g/dL (ref 31.5–35.7)
MCV: 90 fL (ref 79–97)
Monocytes Absolute: 0.4 10*3/uL (ref 0.1–0.9)
Monocytes: 7 %
Neutrophils Absolute: 3 10*3/uL (ref 1.4–7.0)
Neutrophils: 60 %
Platelets: 153 10*3/uL (ref 150–450)
RBC: 5.3 x10E6/uL (ref 4.14–5.80)
RDW: 12.4 % (ref 11.6–15.4)
WBC: 5 10*3/uL (ref 3.4–10.8)

## 2022-12-06 LAB — CMP14+EGFR
ALT: 32 [IU]/L (ref 0–44)
AST: 19 [IU]/L (ref 0–40)
Albumin: 3.9 g/dL (ref 3.8–4.9)
Alkaline Phosphatase: 131 [IU]/L — ABNORMAL HIGH (ref 44–121)
BUN/Creatinine Ratio: 7 — ABNORMAL LOW (ref 9–20)
BUN: 8 mg/dL (ref 6–24)
Bilirubin Total: 0.3 mg/dL (ref 0.0–1.2)
CO2: 24 mmol/L (ref 20–29)
Calcium: 9 mg/dL (ref 8.7–10.2)
Chloride: 103 mmol/L (ref 96–106)
Creatinine, Ser: 1.16 mg/dL (ref 0.76–1.27)
Globulin, Total: 2.5 g/dL (ref 1.5–4.5)
Glucose: 190 mg/dL — ABNORMAL HIGH (ref 70–99)
Potassium: 4.4 mmol/L (ref 3.5–5.2)
Sodium: 141 mmol/L (ref 134–144)
Total Protein: 6.4 g/dL (ref 6.0–8.5)
eGFR: 74 mL/min/{1.73_m2} (ref >59)

## 2022-12-06 LAB — PHOSPHORUS: Phosphorus: 3.1 mg/dL (ref 2.8–4.1)

## 2022-12-06 LAB — D-DIMER, QUANTITATIVE: D-DIMER: 0.29 mg{FEU}/L (ref 0.00–0.49)

## 2022-12-06 LAB — MAGNESIUM: Magnesium: 2.1 mg/dL (ref 1.6–2.3)

## 2022-12-06 LAB — VITAMIN D 25 HYDROXY (VIT D DEFICIENCY, FRACTURES): Vit D, 25-Hydroxy: 25.8 ng/mL — ABNORMAL LOW (ref 30.0–100.0)

## 2022-12-09 ENCOUNTER — Encounter: Payer: Self-pay | Admitting: Family Medicine

## 2022-12-09 NOTE — Progress Notes (Signed)
Dear Kevin Beck, Your Vitamin D is  low. You need a prescription strength supplement I will send that in for you. Nurse, if at all possible, could you send in a prescription for the patient for vitamin D 50,000 units, 1 p.o. weekly #13 with 3 refills? Many thanks, WS

## 2022-12-10 ENCOUNTER — Telehealth: Payer: Self-pay | Admitting: Family Medicine

## 2022-12-10 ENCOUNTER — Other Ambulatory Visit: Payer: Self-pay | Admitting: *Deleted

## 2022-12-10 MED ORDER — VITAMIN D (ERGOCALCIFEROL) 1.25 MG (50000 UNIT) PO CAPS: 50000.0000 [IU] | ORAL_CAPSULE | ORAL | 3 refills | Status: DC

## 2022-12-10 NOTE — Telephone Encounter (Signed)
Patient wants to know if he should be concerned about his blood sugar. He was not fasting.

## 2022-12-10 NOTE — Telephone Encounter (Signed)
Add on test form completed and turned into [facility]

## 2022-12-10 NOTE — Telephone Encounter (Signed)
Please add an A1c

## 2022-12-12 LAB — SPECIMEN STATUS REPORT

## 2022-12-12 LAB — HGB A1C W/O EAG: Hgb A1c MFr Bld: 7.8 % — ABNORMAL HIGH (ref 4.8–5.6)

## 2022-12-14 ENCOUNTER — Encounter: Payer: Self-pay | Admitting: Family Medicine

## 2022-12-24 ENCOUNTER — Encounter: Payer: Medicare HMO | Admitting: Family Medicine

## 2022-12-27 ENCOUNTER — Encounter: Payer: Self-pay | Admitting: Family Medicine

## 2022-12-27 ENCOUNTER — Ambulatory Visit: Payer: Medicare HMO | Admitting: Family Medicine

## 2022-12-27 VITALS — BP 120/73 | HR 73 | Temp 97.1°F | Ht 79.0 in | Wt 302.2 lb

## 2022-12-27 DIAGNOSIS — Z7984 Long term (current) use of oral hypoglycemic drugs: Secondary | ICD-10-CM | POA: Diagnosis not present

## 2022-12-27 DIAGNOSIS — E119 Type 2 diabetes mellitus without complications: Secondary | ICD-10-CM

## 2022-12-27 MED ORDER — DAPAGLIFLOZIN PROPANEDIOL 5 MG PO TABS: 5.0000 mg | ORAL_TABLET | Freq: Every day | ORAL | 1 refills | Status: DC

## 2022-12-27 MED ORDER — BLOOD GLUCOSE MONITORING SUPPL DEVI: 1.0000 | Freq: Three times a day (TID) | 0 refills | Status: AC

## 2022-12-27 MED ORDER — LANCETS MISC. MISC: 1.0000 | Freq: Three times a day (TID) | 0 refills | Status: AC

## 2022-12-27 MED ORDER — BLOOD GLUCOSE TEST VI STRP: 1.0000 | ORAL_STRIP | Freq: Three times a day (TID) | 0 refills | Status: AC

## 2022-12-27 MED ORDER — LANCET DEVICE MISC: 1.0000 | Freq: Three times a day (TID) | 0 refills | Status: AC

## 2022-12-27 NOTE — Patient Instructions (Signed)
Diabetes Mellitus and Foot Care Diabetes, also called diabetes mellitus, may cause problems with your feet and legs because of poor blood flow (circulation). Poor circulation may make your skin: Become thinner and drier. Break more easily. Heal more slowly. Peel and crack. You may also have nerve damage (neuropathy). This can cause decreased feeling in your legs and feet. This means that you may not notice minor injuries to your feet that could lead to more serious problems. Finding and treating problems early is the best way to prevent future foot problems. How to care for your feet Foot hygiene  Wash your feet daily with warm water and mild soap. Do not use hot water. Then, pat your feet and the areas between your toes until they are fully dry. Do not soak your feet. This can dry your skin. Trim your toenails straight across. Do not dig under them or around the cuticle. File the edges of your nails with an emery board or nail file. Apply a moisturizing lotion or petroleum jelly to the skin on your feet and to dry, brittle toenails. Use lotion that does not contain alcohol and is unscented. Do not apply lotion between your toes. Shoes and socks Wear clean socks or stockings every day. Make sure they are not too tight. Do not wear knee-high stockings. These may decrease blood flow to your legs. Wear shoes that fit well and have enough cushioning. Always look in your shoes before you put them on to be sure there are no objects inside. To break in new shoes, wear them for just a few hours a day. This prevents injuries on your feet. Wounds, scrapes, corns, and calluses  Check your feet daily for blisters, cuts, bruises, sores, and redness. If you cannot see the bottom of your feet, use a mirror or ask someone for help. Do not cut off corns or calluses or try to remove them with medicine. If you find a minor scrape, cut, or break in the skin on your feet, keep it and the skin around it clean and  dry. You may clean these areas with mild soap and water. Do not clean the area with peroxide, alcohol, or iodine. If you have a wound, scrape, corn, or callus on your foot, look at it several times a day to make sure it is healing and not infected. Check for: Redness, swelling, or pain. Fluid or blood. Warmth. Pus or a bad smell. General tips Do not cross your legs. This may decrease blood flow to your feet. Do not use heating pads or hot water bottles on your feet. They may burn your skin. If you have lost feeling in your feet or legs, you may not know this is happening until it is too late. Protect your feet from hot and cold by wearing shoes, such as at the beach or on hot pavement. Schedule a complete foot exam at least once a year or more often if you have foot problems. Report any cuts, sores, or bruises to your health care provider right away. Where to find more information American Diabetes Association: diabetes.org Association of Diabetes Care & Education Specialists: diabeteseducator.org Contact a health care provider if: You have a condition that increases your risk of infection, and you have any cuts, sores, or bruises on your feet. You have an injury that is not healing. You have redness on your legs or feet. You feel burning or tingling in your legs or feet. You have pain or cramps in your legs  and feet. Your legs or feet are numb. Your feet always feel cold. You have pain around any toenails. Get help right away if: You have a wound, scrape, corn, or callus on your foot and: You have signs of infection. You have a fever. You have a red line going up your leg. This information is not intended to replace advice given to you by your health care provider. Make sure you discuss any questions you have with your health care provider. Document Revised: 08/09/2021 Document Reviewed: 08/09/2021 Elsevier Patient Education  2024 Else[employer] Inc. Diabetes Mellitus and Exercise Regular  exercise is important for your health, especially if you have diabetes mellitus. Exercise is not just about losing weight. It can also help you increase muscle strength and bone density and reduce body fat and stress. This can help your level of endurance and make you more fit and flexible. Why should I exercise if I have diabetes? Exercise has many benefits for people with diabetes. It can: Help lower and control your blood sugar (glucose). Help your body respond better and become more sensitive to the hormone insulin. Reduce how much insulin your body needs. Lower your risk for heart disease by: Lowering how much "bad" cholesterol and triglycerides you have in your body. Increasing how much "good" cholesterol you have in your body. Lowering your blood pressure. Lowering your blood glucose levels. What is my activity plan? Your health care provider or an expert trained in diabetes care (certified diabetes educator) can help you make an activity plan. This plan can help you find the type of exercise that works for you. It may also tell you how often to exercise and for how long. Be sure to: Get at least 150 minutes of medium-intensity or high-intensity exercise each week. This may involve brisk walking, biking, or water aerobics. Do stretching and strengthening exercises at least 2 times a week. This may involve yoga or weight lifting. Spread out your activity over at least 3 days of the week. Get some form of physical activity each day. Do not go more than 2 days in a row without some kind of activity. Avoid being inactive for more than 30 minutes at a time. Take frequent breaks to walk or stretch. Choose activities that you enjoy. Set goals that you know you can accomplish. Start slowly and increase the intensity of your exercise over time. How do I manage my diabetes during exercise?  Monitor your blood glucose Check your blood glucose before and after you exercise. If your blood glucose  is 240 mg/dL (16.1 mmol/L) or higher before you exercise, check your urine for ketones. These are chemicals created by the liver. If you have ketones in your urine, do not exercise until your blood glucose returns to normal. If your blood glucose is 100 mg/dL (5.6 mmol/L) or lower, eat a snack that has 15-20 grams of carbohydrate in it. Check your blood glucose 15 minutes after the snack to make sure that your level is above 100 mg/dL (5.6 mmol/L) before you start to exercise. Your risk for low blood glucose (hypoglycemia) goes up during and after exercise. Know the symptoms of this condition and how to treat it. Follow these instructions at home: Keep a carbohydrate snack on hand for use before, during, and after exercise. This can help prevent or treat hypoglycemia. Avoid injecting insulin into parts of your body that are going to be used during exercise. This may include: Your arms, when you are going to play tennis. Your  legs, when you are about to go jogging. Keep track of your exercise habits. This can help you and your health care provider watch and adjust your activity plan. Write down: What you eat before and after you exercise. Blood glucose levels before and after you exercise. The type and amount of exercise you do. Talk to your health care provider before you start a new activity. They may need to: Make sure that the activity is safe for you. Adjust your insulin, other medicines, and food that you eat. Drink water while you exercise. This can stop you from losing too much water (dehydration). It can also prevent problems caused by having a lot of heat in your body (heat stroke). Where to find more information American Diabetes Association: diabetes.org Association of Diabetes Care & Education Specialists: diabeteseducator.org This information is not intended to replace advice given to you by your health care provider. Make sure you discuss any questions you have with your health care  provider. Document Revised: 07/26/2021 Document Reviewed: 07/26/2021 Elsevier Patient Education  2024 Elsevier Inc. Carbohydrate Counting for Diabetes Mellitus, Adult Carbohydrate counting is a method of keeping track of how many carbohydrates you eat. Eating carbohydrates increases the amount of sugar (glucose) in the blood. Counting how many carbohydrates you eat improves how well you manage your blood glucose. This, in turn, helps you manage your diabetes. Carbohydrates are measured in grams (g) per serving. It is important to know how many carbohydrates (in grams or by serving size) you can have in each meal. This is different for every person. A dietitian can help you make a meal plan and calculate how many carbohydrates you should have at each meal and snack. What foods contain carbohydrates? Carbohydrates are found in the following foods: Grains, such as breads and cereals. Dried beans and soy products. Starchy vegetables, such as potatoes, peas, and corn. Fruit and fruit juices. Milk and yogurt. Sweets and snack foods, such as cake, cookies, candy, chips, and soft drinks. How do I count carbohydrates in foods? There are two ways to count carbohydrates in food. You can read food labels or learn standard serving sizes of foods. You can use either of these methods or a combination of both. Using the Nutrition Facts label The Nutrition Facts list is included on the labels of almost all packaged foods and beverages in the [place]. It includes: The serving size. Information about nutrients in each serving, including the grams of carbohydrate per serving. To use the Nutrition Facts, decide how many servings you will have. Then, multiply the number of servings by the number of carbohydrates per serving. The resulting number is the total grams of carbohydrates that you will be having. Learning the standard serving sizes of foods When you eat carbohydrate foods that are not packaged or do  not include Nutrition Facts on the label, you need to measure the servings in order to count the grams of carbohydrates. Measure the foods that you will eat with a food scale or measuring cup, if needed. Decide how many standard-size servings you will eat. Multiply the number of servings by 15. For foods that contain carbohydrates, one serving equals 15 g of carbohydrates. For example, if you eat 2 cups or 10 oz (300 g) of strawberries, you will have eaten 2 servings and 30 g of carbohydrates (2 servings x 15 g = 30 g). For foods that have more than one food mixed, such as soups and casseroles, you must count the carbohydrates in each  food that is included. The following list contains standard serving sizes of common carbohydrate-rich foods. Each of these servings has about 15 g of carbohydrates: 1 slice of bread. 1 six-inch (15 cm) tortilla. ? cup or 2 oz (53 g) cooked rice or pasta.  cup or 3 oz (85 g) cooked or canned, drained and rinsed beans or lentils.  cup or 3 oz (85 g) starchy vegetable, such as peas, corn, or squash.  cup or 4 oz (120 g) hot cereal.  cup or 3 oz (85 g) boiled or mashed potatoes, or  or 3 oz (85 g) of a large baked potato.  cup or 4 fl oz (118 mL) fruit juice. 1 cup or 8 fl oz (237 mL) milk. 1 small or 4 oz (106 g) apple.  or 2 oz (63 g) of a medium banana. 1 cup or 5 oz (150 g) strawberries. 3 cups or 1 oz (28.3 g) popped popcorn. What is an example of carbohydrate counting? To calculate the grams of carbohydrates in this sample meal, follow the steps shown below. Sample meal 3 oz (85 g) chicken breast. ? cup or 4 oz (106 g) brown rice.  cup or 3 oz (85 g) corn. 1 cup or 8 fl oz (237 mL) milk. 1 cup or 5 oz (150 g) strawberries with sugar-free whipped topping. Carbohydrate calculation Identify the foods that contain carbohydrates: Rice. Corn. Milk. Strawberries. Calculate how many servings you have of each food: 2 servings rice. 1 serving  corn. 1 serving milk. 1 serving strawberries. Multiply each number of servings by 15 g: 2 servings rice x 15 g = 30 g. 1 serving corn x 15 g = 15 g. 1 serving milk x 15 g = 15 g. 1 serving strawberries x 15 g = 15 g. Add together all of the amounts to find the total grams of carbohydrates eaten: 30 g + 15 g + 15 g + 15 g = 75 g of carbohydrates total. What are tips for following this plan? Shopping Develop a meal plan and then make a shopping list. Buy fresh and frozen vegetables, fresh and frozen fruit, dairy, eggs, beans, lentils, and whole grains. Look at food labels. Choose foods that have more fiber and less sugar. Avoid processed foods and foods with added sugars. Meal planning Aim to have the same number of grams of carbohydrates at each meal and for each snack time. Plan to have regular, balanced meals and snacks. Where to find more information American Diabetes Association: diabetes.org Centers for Disease Control and Prevention: [employer].no Academy of Nutrition and Dietetics: eatright.org Association of Diabetes Care & Education Specialists: diabeteseducator.org Summary Carbohydrate counting is a method of keeping track of how many carbohydrates you eat. Eating carbohydrates increases the amount of sugar (glucose) in your blood. Counting how many carbohydrates you eat improves how well you manage your blood glucose. This helps you manage your diabetes. A dietitian can help you make a meal plan and calculate how many carbohydrates you should have at each meal and snack. This information is not intended to replace advice given to you by your health care provider. Make sure you discuss any questions you have with your health care provider. Document Revised: 09/09/2019 Document Reviewed: 09/09/2019 Elsevier Patient Education  2024 [employer].
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
## 2022-12-30 ENCOUNTER — Encounter: Payer: Self-pay | Admitting: Family Medicine

## 2022-12-31 ENCOUNTER — Ambulatory Visit (INDEPENDENT_AMBULATORY_CARE_PROVIDER_SITE_OTHER): Payer: Medicare HMO | Admitting: Family Medicine

## 2022-12-31 ENCOUNTER — Encounter: Payer: Self-pay | Admitting: Family Medicine

## 2022-12-31 VITALS — BP 117/71 | HR 75 | Temp 97.1°F | Ht 79.0 in | Wt 300.2 lb

## 2022-12-31 DIAGNOSIS — Z1211 Encounter for screening for malignant neoplasm of colon: Secondary | ICD-10-CM

## 2022-12-31 DIAGNOSIS — F17210 Nicotine dependence, cigarettes, uncomplicated: Secondary | ICD-10-CM | POA: Diagnosis not present

## 2022-12-31 DIAGNOSIS — Z0001 Encounter for general adult medical examination with abnormal findings: Secondary | ICD-10-CM | POA: Diagnosis not present

## 2022-12-31 DIAGNOSIS — Z0000 Encounter for general adult medical examination without abnormal findings: Secondary | ICD-10-CM

## 2022-12-31 DIAGNOSIS — L409 Psoriasis, unspecified: Secondary | ICD-10-CM

## 2022-12-31 MED ORDER — HALOBETASOL PROPIONATE 0.05 % EX OINT: TOPICAL_OINTMENT | Freq: Two times a day (BID) | CUTANEOUS | 2 refills | Status: AC
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
## 2023-01-29 ENCOUNTER — Telehealth: Payer: Self-pay | Admitting: Family Medicine

## 2023-01-29 NOTE — Telephone Encounter (Unsigned)
Copied from CRM [id number]. Topic: Clinical - Medication Question >> Jan 29, 2023  2:34 PM Almira Coaster wrote: Reason for CRM: Patient is being charged $200 for dapagliflozin propanediol (FARXIGA) 5 MG TABS tablet , he would like to know if there are any samples that the patient can have until Jan

## 2023-01-31 ENCOUNTER — Ambulatory Visit: Payer: Medicare HMO | Admitting: Family Medicine

## 2023-02-05 ENCOUNTER — Ambulatory Visit (INDEPENDENT_AMBULATORY_CARE_PROVIDER_SITE_OTHER): Payer: Medicare HMO | Admitting: Family Medicine

## 2023-02-05 ENCOUNTER — Encounter: Payer: Self-pay | Admitting: Family Medicine

## 2023-02-05 VITALS — BP 118/74 | HR 71 | Temp 97.6°F | Ht 79.0 in | Wt 297.2 lb

## 2023-02-05 DIAGNOSIS — Z7984 Long term (current) use of oral hypoglycemic drugs: Secondary | ICD-10-CM

## 2023-02-05 DIAGNOSIS — E119 Type 2 diabetes mellitus without complications: Secondary | ICD-10-CM | POA: Diagnosis not present

## 2023-02-05 DIAGNOSIS — Z1211 Encounter for screening for malignant neoplasm of colon: Secondary | ICD-10-CM
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
## 2023-02-07 DIAGNOSIS — Z1212 Encounter for screening for malignant neoplasm of rectum: Secondary | ICD-10-CM | POA: Diagnosis not present

## 2023-02-07 DIAGNOSIS — Z1211 Encounter for screening for malignant neoplasm of colon: Secondary | ICD-10-CM | POA: Diagnosis not present

## 2023-02-13 LAB — COLOGUARD: COLOGUARD: NEGATIVE

## 2023-02-26 DIAGNOSIS — I517 Cardiomegaly: Secondary | ICD-10-CM | POA: Diagnosis not present

## 2023-02-27 ENCOUNTER — Other Ambulatory Visit: Payer: Self-pay | Admitting: Family Medicine

## 2023-03-13 ENCOUNTER — Encounter: Payer: Self-pay | Admitting: Family Medicine

## 2023-03-13 ENCOUNTER — Ambulatory Visit: Payer: Medicare (Managed Care) | Admitting: Family Medicine

## 2023-03-13 VITALS — BP 110/59 | HR 73 | Temp 97.4°F | Ht 79.0 in | Wt 285.0 lb

## 2023-03-13 DIAGNOSIS — Z7901 Long term (current) use of anticoagulants: Secondary | ICD-10-CM | POA: Diagnosis not present

## 2023-03-13 DIAGNOSIS — E119 Type 2 diabetes mellitus without complications: Secondary | ICD-10-CM

## 2023-03-13 DIAGNOSIS — Z7984 Long term (current) use of oral hypoglycemic drugs: Secondary | ICD-10-CM
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
## 2023-03-14 LAB — MICROALBUMIN / CREATININE URINE RATIO
Creatinine, Urine: 119.2 mg/dL
Microalb/Creat Ratio: <3 mg/g{creat} (ref 0–29)
Microalbumin, Urine: <3 ug/mL

## 2023-03-14 LAB — BAYER DCA HB A1C WAIVED: HB A1C (BAYER DCA - WAIVED): 5.6 % (ref 4.8–5.6)

## 2023-06-06 ENCOUNTER — Encounter: Payer: Medicare HMO | Admitting: Family Medicine

## 2023-06-18 ENCOUNTER — Ambulatory Visit: Payer: Medicare (Managed Care) | Admitting: Family Medicine

## 2023-06-19 ENCOUNTER — Ambulatory Visit (INDEPENDENT_AMBULATORY_CARE_PROVIDER_SITE_OTHER): Payer: Medicare (Managed Care) | Admitting: Family Medicine

## 2023-06-19 ENCOUNTER — Encounter: Payer: Self-pay | Admitting: Family Medicine

## 2023-06-19 VITALS — BP 110/55 | HR 73 | Temp 98.1°F | Ht 79.0 in | Wt 254.0 lb

## 2023-06-19 DIAGNOSIS — Z7901 Long term (current) use of anticoagulants: Secondary | ICD-10-CM | POA: Diagnosis not present

## 2023-06-19 DIAGNOSIS — E119 Type 2 diabetes mellitus without complications: Secondary | ICD-10-CM | POA: Diagnosis not present

## 2023-06-19 LAB — BAYER DCA HB A1C WAIVED: HB A1C (BAYER DCA - WAIVED): 5.5 % (ref 4.8–5.6)

## 2023-06-19 MED ORDER — UMECLIDINIUM-VILANTEROL 62.5-25 MCG/ACT IN AEPB: 1.0000 | INHALATION_SPRAY | Freq: Every day | RESPIRATORY_TRACT | 11 refills | Status: DC

## 2023-06-19 MED ORDER — ROSUVASTATIN CALCIUM 10 MG PO TABS: 10.0000 mg | ORAL_TABLET | Freq: Every day | ORAL | 1 refills | Status: DC

## 2023-06-19 MED ORDER — VITAMIN D (ERGOCALCIFEROL) 1.25 MG (50000 UNIT) PO CAPS: 50000.0000 [IU] | ORAL_CAPSULE | ORAL | 3 refills | Status: AC
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
## 2023-06-20 LAB — CBC WITH DIFFERENTIAL/PLATELET
Basophils Absolute: 0.1 10*3/uL (ref 0.0–0.2)
Basos: 1 %
EOS (ABSOLUTE): 0.2 10*3/uL (ref 0.0–0.4)
Eos: 4 %
Hematocrit: 46.6 % (ref 37.5–51.0)
Hemoglobin: 15.7 g/dL (ref 13.0–17.7)
Immature Grans (Abs): 0 10*3/uL (ref 0.0–0.1)
Immature Granulocytes: 0 %
Lymphocytes Absolute: 1.7 10*3/uL (ref 0.7–3.1)
Lymphs: 29 %
MCH: 30.1 pg (ref 26.6–33.0)
MCHC: 33.7 g/dL (ref 31.5–35.7)
MCV: 89 fL (ref 79–97)
Monocytes Absolute: 0.4 10*3/uL (ref 0.1–0.9)
Monocytes: 6 %
Neutrophils Absolute: 3.5 10*3/uL (ref 1.4–7.0)
Neutrophils: 60 %
Platelets: 150 10*3/uL (ref 150–450)
RBC: 5.22 x10E6/uL (ref 4.14–5.80)
RDW: 13.5 % (ref 11.6–15.4)
WBC: 5.9 10*3/uL (ref 3.4–10.8)

## 2023-06-20 LAB — CMP14+EGFR
ALT: 13 IU/L (ref 0–44)
AST: 13 IU/L (ref 0–40)
Albumin: 4.1 g/dL (ref 3.8–4.9)
Alkaline Phosphatase: 109 IU/L (ref 44–121)
BUN/Creatinine Ratio: 12 (ref 9–20)
BUN: 11 mg/dL (ref 6–24)
Bilirubin Total: 0.6 mg/dL (ref 0.0–1.2)
CO2: 24 mmol/L (ref 20–29)
Calcium: 9.2 mg/dL (ref 8.7–10.2)
Chloride: 106 mmol/L (ref 96–106)
Creatinine, Ser: 0.94 mg/dL (ref 0.76–1.27)
Globulin, Total: 2.4 g/dL (ref 1.5–4.5)
Glucose: 84 mg/dL (ref 70–99)
Potassium: 4.5 mmol/L (ref 3.5–5.2)
Sodium: 142 mmol/L (ref 134–144)
Total Protein: 6.5 g/dL (ref 6.0–8.5)
eGFR: 96 mL/min/{1.73_m2} (ref >59)

## 2023-06-21 ENCOUNTER — Other Ambulatory Visit: Payer: Self-pay | Admitting: *Deleted

## 2023-06-21 MED ORDER — UMECLIDINIUM-VILANTEROL 62.5-25 MCG/ACT IN AEPB: 1.0000 | INHALATION_SPRAY | Freq: Every day | RESPIRATORY_TRACT | 11 refills | Status: AC

## 2023-06-21 MED ORDER — ROSUVASTATIN CALCIUM 10 MG PO TABS: 10.0000 mg | ORAL_TABLET | Freq: Every day | ORAL | 0 refills | Status: DC

## 2023-06-23 ENCOUNTER — Encounter: Payer: Self-pay | Admitting: Family Medicine

## 2023-06-23 NOTE — Progress Notes (Signed)
Hello Jabron,  Your lab result is normal and/or stable.Some minor variations that are not significant are commonly marked abnormal, but do not represent any medical problem for you.  Best regards, Emunah Texidor, M.D.

## 2023-07-29 ENCOUNTER — Telehealth: Payer: Self-pay | Admitting: Family Medicine

## 2023-09-03 ENCOUNTER — Other Ambulatory Visit: Payer: Self-pay | Admitting: Family Medicine

## 2023-09-23 ENCOUNTER — Ambulatory Visit: Payer: Medicare (Managed Care) | Admitting: Family Medicine

## 2023-09-23 ENCOUNTER — Encounter: Payer: Self-pay | Admitting: Family Medicine

## 2023-09-23 VITALS — BP 107/56 | HR 67 | Temp 97.5°F | Ht 79.0 in | Wt 262.2 lb

## 2023-09-23 DIAGNOSIS — Z23 Encounter for immunization: Secondary | ICD-10-CM

## 2023-09-23 DIAGNOSIS — E119 Type 2 diabetes mellitus without complications: Secondary | ICD-10-CM

## 2023-09-23 DIAGNOSIS — E785 Hyperlipidemia, unspecified: Secondary | ICD-10-CM

## 2023-09-23 LAB — BAYER DCA HB A1C WAIVED: HB A1C (BAYER DCA - WAIVED): 5.9 % — ABNORMAL HIGH (ref 4.8–5.6)

## 2023-09-24 ENCOUNTER — Ambulatory Visit: Payer: Self-pay | Admitting: Family Medicine

## 2023-09-24 LAB — LIPID PANEL
Chol/HDL Ratio: 2.5 ratio (ref 0.0–5.0)
Cholesterol, Total: 112 mg/dL (ref 100–199)
HDL: 44 mg/dL (ref >39)
LDL Chol Calc (NIH): 55 mg/dL (ref 0–99)
Triglycerides: 56 mg/dL (ref 0–149)
VLDL Cholesterol Cal: 13 mg/dL (ref 5–40)

## 2023-09-24 NOTE — Progress Notes (Signed)
Hello Jabron,  Your lab result is normal and/or stable.Some minor variations that are not significant are commonly marked abnormal, but do not represent any medical problem for you.  Best regards, Emunah Texidor, M.D.

## 2023-09-29 ENCOUNTER — Encounter: Payer: Self-pay | Admitting: Family Medicine
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
## 2023-11-15 ENCOUNTER — Other Ambulatory Visit: Payer: Self-pay | Admitting: Family Medicine

## 2023-12-16 ENCOUNTER — Other Ambulatory Visit: Payer: Self-pay | Admitting: Family Medicine

## 2023-12-16 NOTE — Telephone Encounter (Signed)
 Message from Alfonso ORN sent at 12/16/2023  4:29 PM EDT  Summary: patient is out of  the rivaroxaban  (XARELTO ) 10 MG TABS tablet   Reason for Triage: patient is out of  the rivaroxaban  (XARELTO ) 10 MG TABS tablet Been out of the blood thinner for 2 days  Pharmacy: [facility] [place] - [address] [place] [address] Phone: [phone] Fax: [phone] Hours: Not open 24 hours

## 2023-12-17 ENCOUNTER — Other Ambulatory Visit: Payer: Self-pay | Admitting: Family Medicine

## 2023-12-17 MED ORDER — RIVAROXABAN 10 MG PO TABS: 10.0000 mg | ORAL_TABLET | Freq: Every day | ORAL | 0 refills | Status: AC

## 2023-12-17 MED ORDER — RIVAROXABAN 10 MG PO TABS: 10.0000 mg | ORAL_TABLET | Freq: Every day | ORAL | 2 refills | Status: DC

## 2023-12-17 NOTE — Telephone Encounter (Signed)
 Copied from CRM [id number]. Topic: Clinical - Prescription Issue >> Dec 17, 2023 11:39 AM Wess RAMAN wrote: Reason for CRM: Patient stated he has been out of rivaroxaban  (XARELTO ) 10 MG TABS tablet for four days and he is going out of town  [place] #: [id number]  Pharmacy: [facility] [place] [place] - [address] [place] [address] Phone: [phone] Fax: [phone] Hours: Not open 24 hours

## 2023-12-26 ENCOUNTER — Ambulatory Visit: Payer: Medicare (Managed Care) | Admitting: Family Medicine

## 2023-12-26 ENCOUNTER — Ambulatory Visit: Payer: Self-pay | Admitting: Family Medicine

## 2023-12-26 ENCOUNTER — Encounter: Payer: Self-pay | Admitting: Family Medicine

## 2023-12-26 VITALS — BP 127/72 | HR 72 | Temp 98.0°F | Ht 79.0 in | Wt 260.0 lb

## 2023-12-26 DIAGNOSIS — E119 Type 2 diabetes mellitus without complications: Secondary | ICD-10-CM

## 2023-12-26 DIAGNOSIS — F17210 Nicotine dependence, cigarettes, uncomplicated: Secondary | ICD-10-CM | POA: Diagnosis not present

## 2023-12-26 DIAGNOSIS — E559 Vitamin D deficiency, unspecified: Secondary | ICD-10-CM

## 2023-12-26 DIAGNOSIS — E785 Hyperlipidemia, unspecified: Secondary | ICD-10-CM | POA: Diagnosis not present

## 2023-12-26 DIAGNOSIS — Z23 Encounter for immunization: Secondary | ICD-10-CM

## 2023-12-26 LAB — BAYER DCA HB A1C WAIVED: HB A1C (BAYER DCA - WAIVED): 5.7 % — ABNORMAL HIGH (ref 4.8–5.6)
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
## 2023-12-27 LAB — LIPID PANEL
Chol/HDL Ratio: 3.3 ratio (ref 0.0–5.0)
Cholesterol, Total: 157 mg/dL (ref 100–199)
HDL: 48 mg/dL (ref >39)
LDL Chol Calc (NIH): 88 mg/dL (ref 0–99)
Triglycerides: 117 mg/dL (ref 0–149)
VLDL Cholesterol Cal: 21 mg/dL (ref 5–40)

## 2023-12-27 LAB — MICROALBUMIN / CREATININE URINE RATIO
Creatinine, Urine: 160.2 mg/dL
Microalb/Creat Ratio: 4 mg/g{creat} (ref 0–29)
Microalbumin, Urine: 6.3 ug/mL

## 2023-12-27 LAB — VITAMIN D 25 HYDROXY (VIT D DEFICIENCY, FRACTURES): Vit D, 25-Hydroxy: 36.9 ng/mL (ref 30.0–100.0)

## 2023-12-30 NOTE — Progress Notes (Signed)
Hello Jabron,  Your lab result is normal and/or stable.Some minor variations that are not significant are commonly marked abnormal, but do not represent any medical problem for you.  Best regards, Emunah Texidor, M.D.

## 2024-01-28 ENCOUNTER — Ambulatory Visit: Payer: Medicare (Managed Care)

## 2024-01-28 VITALS — BP 127/72 | HR 72 | Ht 79.0 in | Wt 260.0 lb

## 2024-01-28 DIAGNOSIS — Z0000 Encounter for general adult medical examination without abnormal findings: Secondary | ICD-10-CM | POA: Diagnosis not present

## 2024-01-28 DIAGNOSIS — Z122 Encounter for screening for malignant neoplasm of respiratory organs: Secondary | ICD-10-CM

## 2024-01-28 NOTE — Patient Instructions (Signed)
 Mr. Douty,  Thank you for taking the time for your Medicare Wellness Visit. I appreciate your continued commitment to your health goals. Please review the care plan we discussed, and feel free to reach out if I can assist you further.  Please note that Annual Wellness Visits do not include a physical exam. Some assessments may be limited, especially if the visit was conducted virtually. If needed, we may recommend an in-person follow-up with your provider.  Ongoing Care Seeing your primary care provider every 3 to 6 months helps us  monitor your health and provide consistent, personalized care.   Referrals If a referral was made during today's visit and you haven't received any updates within two weeks, please contact the referred provider directly to check on the status.  Recommended Screenings:  Health Maintenance  Topic Date Due   Hepatitis C Screening  Never done   Hepatitis B Vaccine (1 of 3 - 19+ 3-dose series) Never done   COVID-19 Vaccine (3 - Moderna risk series) 07/24/2019   Eye exam for diabetics  05/03/2023   Screening for Lung Cancer  10/01/2023   Medicare Annual Wellness Visit  12/31/2023   Flu Shot  05/19/2024*   Pneumococcal Vaccine for age over 62 (1 of 2 - PCV) 09/22/2024*   Yearly kidney function blood test for diabetes  06/18/2024   Hemoglobin A1C  06/24/2024   Yearly kidney health urinalysis for diabetes  12/25/2024   Complete foot exam   12/25/2024   Cologuard (Stool DNA test)  02/06/2026   DTaP/Tdap/Td vaccine (2 - Td or Tdap) 12/04/2032   HIV Screening  Completed   Zoster (Shingles) Vaccine  Completed   HPV Vaccine  Aged Out   Meningitis B Vaccine  Aged Out  *Topic was postponed. The date shown is not the original due date.       01/28/2024   10:03 AM  Advanced Directives  Does Patient Have a Medical Advance Directive? No  Would patient like information on creating a medical advance directive? No - Patient declined    Vision: Annual vision  screenings are recommended for early detection of glaucoma, cataracts, and diabetic retinopathy. These exams can also reveal signs of chronic conditions such as diabetes and high blood pressure.  Dental: Annual dental screenings help detect early signs of oral cancer, gum disease, and other conditions linked to overall health, including heart disease and diabetes.  Please see the attached documents for additional preventive care recommendations.
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
## 2024-03-10 ENCOUNTER — Ambulatory Visit: Payer: Medicare (Managed Care) | Admitting: Family Medicine

## 2024-03-10 ENCOUNTER — Ambulatory Visit: Payer: Self-pay

## 2024-03-10 ENCOUNTER — Encounter: Payer: Self-pay | Admitting: Family Medicine

## 2024-03-10 VITALS — BP 115/62 | HR 62 | Temp 98.5°F | Ht 79.0 in | Wt 265.0 lb

## 2024-03-10 DIAGNOSIS — R600 Localized edema: Secondary | ICD-10-CM | POA: Diagnosis not present

## 2024-03-10 DIAGNOSIS — S8012XA Contusion of left lower leg, initial encounter: Secondary | ICD-10-CM | POA: Diagnosis not present

## 2024-03-10 DIAGNOSIS — Z7901 Long term (current) use of anticoagulants: Secondary | ICD-10-CM

## 2024-03-10 NOTE — Telephone Encounter (Signed)
 Apt scheduled.
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
## 2024-03-11 ENCOUNTER — Telehealth: Payer: Self-pay | Admitting: Family Medicine

## 2024-03-11 ENCOUNTER — Ambulatory Visit (HOSPITAL_COMMUNITY)
Admission: RE | Admit: 2024-03-11 | Discharge: 2024-03-11 | Disposition: A | Discharge status: Home / Self Care | Payer: Medicare (Managed Care) | Source: Ambulatory Visit | Attending: Family Medicine | Admitting: Family Medicine

## 2024-03-11 DIAGNOSIS — R600 Localized edema: Secondary | ICD-10-CM | POA: Insufficient documentation

## 2024-03-11 NOTE — Telephone Encounter (Signed)
 Pt aware of US  DVT today at [facility].

## 2024-03-12 ENCOUNTER — Ambulatory Visit: Payer: Self-pay | Admitting: Family Medicine

## 2024-03-30 ENCOUNTER — Ambulatory Visit: Payer: Medicare (Managed Care) | Admitting: Family Medicine

## 2025-01-28 ENCOUNTER — Ambulatory Visit: Payer: Medicare (Managed Care)
